# Patient Record
Sex: Female | Born: 1990 | Race: White | Hispanic: No | Smoking: Former smoker
Health system: Southern US, Community
[De-identification: ages and names within clinical notes are randomized; demographics above are authoritative.]

## PROBLEM LIST (undated history)

## (undated) ENCOUNTER — Inpatient Hospital Stay (HOSPITAL_COMMUNITY): Payer: Self-pay

## (undated) DIAGNOSIS — G43909 Migraine, unspecified, not intractable, without status migrainosus: Secondary | ICD-10-CM

## (undated) DIAGNOSIS — R12 Heartburn: Secondary | ICD-10-CM

## (undated) DIAGNOSIS — F419 Anxiety disorder, unspecified: Secondary | ICD-10-CM

## (undated) DIAGNOSIS — Z87440 Personal history of urinary (tract) infections: Secondary | ICD-10-CM

## (undated) DIAGNOSIS — Z87448 Personal history of other diseases of urinary system: Secondary | ICD-10-CM

## (undated) HISTORY — DX: Personal history of urinary (tract) infections: Z87.440

## (undated) HISTORY — DX: Anxiety disorder, unspecified: F41.9

## (undated) HISTORY — DX: Personal history of other diseases of urinary system: Z87.448

## (undated) HISTORY — PX: APPENDECTOMY: SHX54

---
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
## 2009-04-09 ENCOUNTER — Emergency Department (HOSPITAL_COMMUNITY): Admission: EM | Admit: 2009-04-09 | Discharge: 2009-04-09 | Payer: Self-pay | Admitting: Emergency Medicine

## 2009-05-05 ENCOUNTER — Emergency Department (HOSPITAL_COMMUNITY): Admission: EM | Admit: 2009-05-05 | Discharge: 2009-05-05 | Payer: Self-pay | Admitting: Emergency Medicine

## 2009-05-08 ENCOUNTER — Emergency Department (HOSPITAL_COMMUNITY): Admission: EM | Admit: 2009-05-08 | Discharge: 2009-05-09 | Payer: Self-pay | Admitting: Emergency Medicine

## 2009-07-13 ENCOUNTER — Emergency Department (HOSPITAL_COMMUNITY): Admission: EM | Admit: 2009-07-13 | Discharge: 2009-07-13 | Payer: Self-pay | Admitting: Emergency Medicine

## 2009-09-07 ENCOUNTER — Emergency Department (HOSPITAL_COMMUNITY): Admission: EM | Admit: 2009-09-07 | Discharge: 2009-09-08 | Payer: Self-pay | Admitting: Emergency Medicine

## 2009-09-08 ENCOUNTER — Emergency Department (HOSPITAL_COMMUNITY): Admission: EM | Admit: 2009-09-08 | Discharge: 2009-09-08 | Payer: Self-pay | Admitting: Emergency Medicine

## 2009-12-04 ENCOUNTER — Emergency Department (HOSPITAL_COMMUNITY): Admission: EM | Admit: 2009-12-04 | Discharge: 2009-12-04 | Payer: Self-pay | Admitting: Emergency Medicine

## 2010-07-12 ENCOUNTER — Emergency Department (HOSPITAL_COMMUNITY): Admission: EM | Admit: 2010-07-12 | Discharge: 2010-04-24 | Payer: Self-pay | Admitting: Emergency Medicine

## 2010-07-17 ENCOUNTER — Emergency Department (HOSPITAL_COMMUNITY)
Admission: EM | Admit: 2010-07-17 | Discharge: 2010-07-17 | Payer: Self-pay | Source: Home / Self Care | Admitting: Emergency Medicine

## 2010-10-18 LAB — CBC
HCT: 42.2 % (ref 36.0–46.0)
Platelets: 218 10*3/uL (ref 150–400)
RBC: 4.88 MIL/uL (ref 3.87–5.11)
RDW: 13.9 % (ref 11.5–15.5)
WBC: 9.2 10*3/uL (ref 4.0–10.5)

## 2010-10-18 LAB — COMPREHENSIVE METABOLIC PANEL
ALT: 46 U/L — ABNORMAL HIGH (ref 0–35)
Albumin: 3.7 g/dL (ref 3.5–5.2)
Alkaline Phosphatase: 69 U/L (ref 39–117)
Chloride: 107 mEq/L (ref 96–112)
Potassium: 3.2 mEq/L — ABNORMAL LOW (ref 3.5–5.1)
Sodium: 137 mEq/L (ref 135–145)
Total Bilirubin: 0.5 mg/dL (ref 0.3–1.2)
Total Protein: 6.3 g/dL (ref 6.0–8.3)

## 2010-10-18 LAB — URINALYSIS, ROUTINE W REFLEX MICROSCOPIC
Bilirubin Urine: NEGATIVE
Ketones, ur: NEGATIVE mg/dL
Nitrite: NEGATIVE
Protein, ur: NEGATIVE mg/dL
pH: 5.5 (ref 5.0–8.0)

## 2010-10-18 LAB — POCT PREGNANCY, URINE: Preg Test, Ur: NEGATIVE

## 2010-10-18 LAB — URINE MICROSCOPIC-ADD ON

## 2010-10-23 LAB — CBC
HCT: 39.9 % (ref 36.0–46.0)
MCV: 84.8 fL (ref 78.0–100.0)
Platelets: 303 10*3/uL (ref 150–400)
RBC: 4.7 MIL/uL (ref 3.87–5.11)
WBC: 9.6 10*3/uL (ref 4.0–10.5)

## 2010-10-23 LAB — COMPREHENSIVE METABOLIC PANEL
AST: 34 U/L (ref 0–37)
Albumin: 4.4 g/dL (ref 3.5–5.2)
Alkaline Phosphatase: 72 U/L (ref 39–117)
CO2: 23 mEq/L (ref 19–32)
Chloride: 110 mEq/L (ref 96–112)
Creatinine, Ser: 0.55 mg/dL (ref 0.4–1.2)
GFR calc Af Amer: >60 mL/min (ref >60)
GFR calc non Af Amer: >60 mL/min (ref >60)
Potassium: 3.7 mEq/L (ref 3.5–5.1)
Total Bilirubin: 0.1 mg/dL — ABNORMAL LOW (ref 0.3–1.2)

## 2010-10-23 LAB — RAPID URINE DRUG SCREEN, HOSP PERFORMED
Amphetamines: NOT DETECTED
Benzodiazepines: NOT DETECTED
Tetrahydrocannabinol: POSITIVE — AB

## 2010-10-23 LAB — ETHANOL: Alcohol, Ethyl (B): 79 mg/dL — ABNORMAL HIGH (ref 0–10)

## 2010-10-23 LAB — ACETAMINOPHEN LEVEL: Acetaminophen (Tylenol), Serum: 87.5 ug/mL — ABNORMAL HIGH (ref 10–30)

## 2010-10-25 LAB — URINE CULTURE
Colony Count: NO GROWTH
Culture: NO GROWTH

## 2010-10-25 LAB — COMPREHENSIVE METABOLIC PANEL
ALT: 22 U/L (ref 0–35)
AST: 27 U/L (ref 0–37)
Albumin: 4.3 g/dL (ref 3.5–5.2)
Calcium: 9.4 mg/dL (ref 8.4–10.5)
Chloride: 104 mEq/L (ref 96–112)
Creatinine, Ser: 0.51 mg/dL (ref 0.4–1.2)
GFR calc Af Amer: >60 mL/min (ref >60)
Sodium: 139 mEq/L (ref 135–145)
Total Bilirubin: 0.6 mg/dL (ref 0.3–1.2)

## 2010-10-25 LAB — RAPID URINE DRUG SCREEN, HOSP PERFORMED
Cocaine: NOT DETECTED
Tetrahydrocannabinol: POSITIVE — AB

## 2010-10-25 LAB — CBC
MCV: 85.3 fL (ref 78.0–100.0)
Platelets: 317 10*3/uL (ref 150–400)
WBC: 13.8 10*3/uL — ABNORMAL HIGH (ref 4.0–10.5)

## 2010-10-25 LAB — BLOOD GAS, ARTERIAL
O2 Content: 2 L/min
O2 Saturation: 99.1 %
Patient temperature: 98.6

## 2010-10-25 LAB — URINE MICROSCOPIC-ADD ON

## 2010-10-25 LAB — SALICYLATE LEVEL: Salicylate Lvl: <4 mg/dL (ref 2.8–20.0)

## 2010-10-25 LAB — URINALYSIS, ROUTINE W REFLEX MICROSCOPIC
Bilirubin Urine: NEGATIVE
Glucose, UA: NEGATIVE mg/dL
Ketones, ur: NEGATIVE mg/dL
Leukocytes, UA: NEGATIVE
Protein, ur: NEGATIVE mg/dL
pH: 6 (ref 5.0–8.0)

## 2010-10-25 LAB — DIFFERENTIAL
Eosinophils Relative: 1 % (ref 0–5)
Lymphocytes Relative: 34 % (ref 12–46)
Lymphs Abs: 4.6 10*3/uL — ABNORMAL HIGH (ref 0.7–4.0)
Monocytes Absolute: 0.8 10*3/uL (ref 0.1–1.0)

## 2010-10-25 LAB — APTT: aPTT: 30 seconds (ref 24–37)

## 2010-10-25 LAB — POCT PREGNANCY, URINE: Preg Test, Ur: NEGATIVE

## 2010-10-25 LAB — ACETAMINOPHEN LEVEL: Acetaminophen (Tylenol), Serum: <10 ug/mL — ABNORMAL LOW (ref 10–30)

## 2010-11-02 ENCOUNTER — Emergency Department (HOSPITAL_COMMUNITY)
Admission: EM | Admit: 2010-11-02 | Discharge: 2010-11-02 | Disposition: A | Discharge status: Home / Self Care | Payer: Self-pay | Attending: Emergency Medicine | Admitting: Emergency Medicine

## 2010-11-02 DIAGNOSIS — R300 Dysuria: Secondary | ICD-10-CM | POA: Insufficient documentation

## 2010-11-02 DIAGNOSIS — R350 Frequency of micturition: Secondary | ICD-10-CM | POA: Insufficient documentation

## 2010-11-02 DIAGNOSIS — N12 Tubulo-interstitial nephritis, not specified as acute or chronic: Secondary | ICD-10-CM | POA: Insufficient documentation

## 2010-11-02 DIAGNOSIS — R109 Unspecified abdominal pain: Secondary | ICD-10-CM | POA: Insufficient documentation

## 2010-11-02 DIAGNOSIS — R509 Fever, unspecified: Secondary | ICD-10-CM | POA: Insufficient documentation

## 2010-11-02 DIAGNOSIS — R110 Nausea: Secondary | ICD-10-CM | POA: Insufficient documentation

## 2010-11-02 LAB — URINE MICROSCOPIC-ADD ON

## 2010-11-02 LAB — URINALYSIS, ROUTINE W REFLEX MICROSCOPIC
Glucose, UA: NEGATIVE mg/dL
Ketones, ur: NEGATIVE mg/dL
Protein, ur: 100 mg/dL — AB

## 2010-11-02 LAB — POCT PREGNANCY, URINE: Preg Test, Ur: NEGATIVE

## 2010-11-08 LAB — URINALYSIS, ROUTINE W REFLEX MICROSCOPIC
Hgb urine dipstick: NEGATIVE
Nitrite: NEGATIVE
Protein, ur: NEGATIVE mg/dL
Urobilinogen, UA: 0.2 mg/dL (ref 0.0–1.0)

## 2010-11-08 LAB — URINE MICROSCOPIC-ADD ON

## 2010-11-08 LAB — WET PREP, GENITAL: Yeast Wet Prep HPF POC: NONE SEEN

## 2010-11-08 LAB — GC/CHLAMYDIA PROBE AMP, GENITAL
Chlamydia, DNA Probe: NEGATIVE
GC Probe Amp, Genital: NEGATIVE

## 2010-11-09 LAB — GC/CHLAMYDIA PROBE AMP, GENITAL: GC Probe Amp, Genital: NEGATIVE

## 2010-11-09 LAB — WET PREP, GENITAL
Trich, Wet Prep: NONE SEEN
Yeast Wet Prep HPF POC: NONE SEEN

## 2010-11-09 LAB — URINALYSIS, ROUTINE W REFLEX MICROSCOPIC
Glucose, UA: NEGATIVE mg/dL
Ketones, ur: NEGATIVE mg/dL
Protein, ur: NEGATIVE mg/dL

## 2010-11-09 LAB — URINE MICROSCOPIC-ADD ON

## 2010-11-15 LAB — URINE MICROSCOPIC-ADD ON

## 2010-11-15 LAB — URINALYSIS, ROUTINE W REFLEX MICROSCOPIC
Glucose, UA: NEGATIVE mg/dL
Ketones, ur: NEGATIVE mg/dL
Protein, ur: 100 mg/dL — AB

## 2010-11-15 LAB — URINE CULTURE: Colony Count: >=100000

## 2010-11-20 LAB — CBC
HCT: 33.8 % — ABNORMAL LOW (ref 36.0–46.0)
Hemoglobin: 9.5 g/dL — ABNORMAL LOW (ref 12.0–15.0)
MCHC: 33 g/dL (ref 30.0–36.0)
MCV: 83.6 fL (ref 78.0–100.0)
Platelets: 280 10*3/uL (ref 150–400)
RDW: 14.8 % (ref 11.5–15.5)
RDW: 14.8 % (ref 11.5–15.5)
WBC: 12.8 10*3/uL — ABNORMAL HIGH (ref 4.0–10.5)

## 2010-12-18 NOTE — Discharge Summary (Signed)
[name], [name]               ACCOUNT NO.:  [id number]   MEDICAL RECORD NO.:  [id number]          PATIENT TYPE:  INP   LOCATION:  9124                          FACILITY:  [facility]   PHYSICIAN:  [name], MD    DATE OF BIRTH:  18-Jul-1991   DATE OF ADMISSION:  09/17/2008  DATE OF DISCHARGE:                               DISCHARGE SUMMARY   REASON FOR ADMISSION:  Attempted trial of labor after cesarean section.   POSTOPERATIVE DIAGNOSES:  Intrauterine pregnancy at 39-3/7 weeks,  attempted trial of labor after cesarean section,  footling breech  presentation, repeat cesarean section via Pfannenstiel incision by Dr.  Silas Flood with assistance of Dr. Noel Gerold.   HOSPITAL COURSE:  Procedure was uncomplicated.  Postop hemoglobin is  11.3.  The patient is ambulating well taking p.o. fluids and solids  without difficulty, passing flatus, emptying her bladder without  complications, denies complaints.  Desires IUD as contraceptive, will be  placed 6 weeks postpartum.   PHYSICAL EXAMINATION:  VITAL SIGNS:  Stable.  HEART:  Regular rhythm and rate.  LUNGS: Clear to auscultation bilaterally.  ABDOMEN:  Soft, nontender.  Bowel sounds are present x4.  Incision is  intact.  No redness, drainage, swelling.  Fundus is firm.  Lochia, small  amount.  EXTREMITIES:  There is no edema in the lower extremities.   ASSESSMENT:  Stable postoperative day #3.   DISCHARGE MEDICATIONS:  Are as follows:  1. Chromagen Forte 1 p.o. b.i.d.  2. Motrin 600 one p.o. q.6 h. p.r.n. cramping.  3. Lortab 5/325 one p.o. q.4 h. p.r.n. pain.   PLAN:  [facility] nurse is to follow up on postop day 5-7 to remove her  staples.  She is to follow up with [facility] in  6 weeks for postpartum checkup and IUD placement.      Zerita Boers, N.M.      [name], MD  Electronically Signed    DL/MEDQ  D:  16/05/9603  T:  09/20/2008  Job:  [id number]

## 2010-12-18 NOTE — Op Note (Signed)
[name], [name]               ACCOUNT NO.:  [id number]   MEDICAL RECORD NO.:  [id number]          PATIENT TYPE:  INP   LOCATION:  2550                         FACILITY:  [facility]   PHYSICIAN:  Ollen Gross. Vernell Morgans, M.D. DATE OF BIRTH:  02/19/1991   DATE OF PROCEDURE:  11/19/2007  DATE OF DISCHARGE:                               OPERATIVE REPORT   PREOPERATIVE DIAGNOSIS:  Acute appendicitis.   POSTOPERATIVE DIAGNOSIS:  Acute appendicitis.   PROCEDURE:  Laparoscopic appendectomy.   SURGEON:  Dr. Ollen Gross. Vernell Morgans, M.D.   ANESTHESIA:  General endotracheal.   PROCEDURE:  After informed consent was obtained, the patient was brought  to the operating room and placed in supine position on the operating  table.  After induction of general anesthesia, the patient's abdomen was  prepped with Betadine and draped in the usual sterile manner.  The area  below the umbilicus was infiltrated with 0.25% Marcaine.  A small  incision was made with a 15 blade knife.  This incision was carried  through the subcutaneous tissue bluntly with a hemostat and [employer]  retractors until the linea alba was identified.  The linea alba was  incised with a 15 blade knife, and each side was grasped with Kocher  clamps and elevated anteriorly.  The preperitoneal space was then probed  bluntly with a hemostat till the peritoneum was opened and access was  gained to the abdominal cavity.  A 0-Vicryl pursestring stitch was  placed in the fascia surrounding the opening, Hasson cannula was placed  through the opening and anchored in place with the previously placed  Vicryl pursestring stitch.  The abdomen was then insufflated with carbon  dioxide without difficulty.  The patient was placed in Trendelenburg  position and rotated with the right side up.  Next, the suprapubic  region was infiltrated with 0.25% Marcaine.  A small incision was made  with 15 blade knife.  This incision was carried down.  A  10-11 mm port  was then placed bluntly through this incision into the abdominal cavity  under direct vision.  The  site was chosen between the tube ports for  placement of a 5-mm port.  This area was infiltrated with 0.25%  Marcaine.  A small stab incision was made with a 15 blade knife and 5-mm  port was placed bluntly through this incision into the abdominal cavity  under direct vision.  The camera was then moved to the suprapubic port  using a Glassman grasper and the harmonic scalpel.  The right lower  quadrant was inspected.  The appendix was readily identified, it did  appear to be enlarged and inflamed, but no signs of perforation.  The  appendix was able to be elevated with the [facility] and the mesoappendix  was taken down sharply with the harmonic scalpel until the base of the  appendix at its junction with the cecum was identified and cleared of  any tissue.  Next, a laparoscopic 45-mm blue load six-row stapler was  placed through the Hasson cannula across the base of the appendix at its  junction  with the cecum clamped and fired, thereby dividing the base of  the appendix between staple lines.  The laparoscopic bag was inserted  through the Hasson cannula.  The appendix was placed in the bag and bag  was sealed.  The abdomen was then inspected.  The abdomen was then  irrigated with copious amounts of saline.  The staple line was examined  and found to be hemostatic.  No other abnormalities were noted.  On  examining the rest of the abdomen, the bag and appendix were removed  through the infraumbilical port with the Hasson cannula without  difficulty.  The fascial defect was closed, the previously placed Vicryl  pursestring stitch as well as with another figure-of-eight 0-Vicryl  stitch.  The rest of the ports were removed under direct vision and  found to be hemostatic.  Gas was allowed to escape.  The skin incisions  where there is fascia of the suprapubic port was also closed with an   interrupted 0 Vicryl stitch.  The rest of the skin incisions were closed  with interrupted 4-0 Monocryl subcuticular stitches and Dermabond  dressings were applied.  The patient tolerated the procedure well.  At  the end of the case, all needle, sponge, and instrument counts were  correct.  The patient was then awakened and taken to recovery room in  stable condition.      Ollen Gross. Vernell Morgans, M.D.  Electronically Signed     PST/MEDQ  D:  11/19/2007  T:  11/19/2007  Job:  [id number]

## 2010-12-18 NOTE — Op Note (Signed)
[name], [name]               ACCOUNT NO.:  [id number]   MEDICAL RECORD NO.:  [id number]          PATIENT TYPE:  INP   LOCATION:  9134                          FACILITY:  [facility]   PHYSICIAN:  Allie Bossier, MD        DATE OF BIRTH:  [birth year]   DATE OF PROCEDURE:  01/15/2007  DATE OF DISCHARGE:                               OPERATIVE REPORT   PREOPERATIVE DIAGNOSIS:  1. Term intrauterine pregnancy.  2. Fetal bradycardia remote from delivery.   POSTOPERATIVE DIAGNOSIS:  1. Term intrauterine pregnancy.  2. Fetal bradycardia remote from delivery.  3. Nuchal cord.   SURGERY:  Primary low transverse cesarean section via Pfannenstiel skin  incision.   ANESTHESIA:  Spinal and Marcaine for local.   SURGEON:  Nicholaus Bloom, M.D.   ASSISTANT:  Carolanne Grumbling, M.D.   ESTIMATED BLOOD LOSS:  600 mL   IV FLUIDS:  3700 mL.   URINE:  100 mL of clear urine.   INDICATIONS:  20 year old G1 at 40 weeks 4 days was admitted in labor.  The patient had bradycardia down to the 80s that persisted despite IV  fluids, O2, discontinuing Pitocin, and rolling the patient.  The patient  was also given terbutaline.  The patient was taken to the operating  room. Fetal heart tones were improved. Sterile vaginal exam the patient  was still very high and felt to be remote from delivery so the patient  was counseled on risks and benefits and wanted to proceed.   FINDINGS:  Viable female infant in cephalic presentation, Apgars 60/45.  Cord pH 7.23, weight 8 pounds 3 ounces.  Amniotic fluid clear, nuchal  cord x1. The uterus, tubes and ovaries within normal limits.   PROCEDURE:  The patient was taken to the operating room where spinal  anesthesia was placed and found be adequate.  She was prepped and draped  in the usual sterile fashion in the dorsal supine position with a  leftward tilt.  Pfannenstiel skin incision was made, carried through the  fascia. The fascial incision was extended bilaterally and  curved  slightly upwards.  The rectus muscles were partially incised in a  transverse fashion using the Bovie.  Bladder blade was inserted.  Uterine incision was made. Amniotomy was performed with hemostats.  Infant's head was delivered atraumatically using the kiwi vacuum that  was pumped to the green zone.  Nuchal cord x1 was reduced, body  delivered atraumatically.  Cord clamped and cut and infant handed off to  the waiting neonatologist.  the uterus was left in situ and cleaned with  a dry lap sponge.  Uterus closed with 0 Vicryl in a running locking  fashion and excellent hemostasis was obtained.  The ovaries were  palpably normal. Fascia was closed with 0 Vicryl in a running fashion.  The subcutaneous tissue was infiltrated with 30 ml of 0.5% marcaine.  It  was then irrigated, cleaned, and dried.  Skin was closed with Monocryl.  Steri-Strips were placed.  The patient tolerated the procedure and was  taken to the recovery room in stable condition.  Sponge, lap and needle  counts were correct x2.  Ancef was given at cord clamp.  Her Foley  catheter drained clear urine throughout the case.     ______________________________  Marc Morgans. Mayford Knife, M.D.      Allie Bossier, MD  Electronically Signed    TLW/MEDQ  D:  01/15/2007  T:  01/15/2007  Job:  [id number]

## 2010-12-18 NOTE — Discharge Summary (Signed)
[name], [name]               ACCOUNT NO.:  [id number]   MEDICAL RECORD NO.:  [id number]          PATIENT TYPE:  INP   LOCATION:  9134                          [facility]   PHYSICIAN:  Lesly Dukes, M.D. DATE OF BIRTH:  29-Oct-1990   DATE OF ADMISSION:  01/15/2007  DATE OF DISCHARGE:  01/17/2007                               DISCHARGE SUMMARY   DISCHARGE DIAGNOSES:  1. Primary low transverse cesarean section at [redacted] weeks gestation age      for fetal bradycardia, remote from delivery.  2. Anemia secondary to acute blood loss.  Admission hemoglobin 12.5,      discharge hemoglobin 10.2.   HOSPITAL COURSE:  This 20 year old G1 at 42-weeks gestational age,  presented with contractions.  She was initially evaluated at 1 cm,  progressed to 5-6 cm. Fetal heart tones were reactive and reassuring.  Labor went well until she had a first descent fetal bradycardia down to  the 80's.  Patient was taken to the operating room where she was dilated  to 8 cm, still negative 1 station.  Fetal heart tones improved briefly.  The decision was made to proceed with the primary cesarean section.  Please see operative note for details.  At delivery the baby had a  nuchal cord. Apgar's were 8 and 9. Birth weight 8 pounds, 3 ounces.  Cord pH 7.23.   Postoperative course was unremarkable.  Patient tolerated p.o., Foley  was discontinued.   DISPOSITION:  Home (patient requested an early discharge).   DISCHARGE MEDICATIONS:  1. Micronor 1 tablet p.o. daily to start in 2 weeks.  2. Percocet 1-2 p.o. q.4-6 hours p.r.n. pain.  3. Motrin 600 mg one tablet p.o. q.6 hours.   ACTIVITY:  Nothing per vagina, no heavy lifting x6 weeks.   DISCHARGE INSTRUCTIONS:  Return with signs/symptoms of infection or with  increased vaginal bleeding.  [facility]  weeks.     ______________________________  Marc Morgans Mayford Knife, M.D.      Lesly Dukes, M.D.  Electronically  Signed    TLW/MEDQ  D:  01/17/2007  T:  01/17/2007  Job:  [id number]

## 2010-12-18 NOTE — Op Note (Signed)
[name], [name]               ACCOUNT NO.:  [id number]   MEDICAL RECORD NO.:  [id number]          PATIENT TYPE:  INP   LOCATION:  9124                          [facility]   PHYSICIAN:  [name], MD    DATE OF BIRTH:  03-20-1991   DATE OF PROCEDURE:  09/17/2008  DATE OF DISCHARGE:                               OPERATIVE REPORT   PREOPERATIVE DIAGNOSES:  1. Intrauterine pregnancy at 39-3/7th weeks' gestation.  2. Attempted trial of labor after cesarean section.  3. Footling breech presentation.   POSTOPERATIVE DIAGNOSES:  1. Intrauterine pregnancy at 39-3/7th weeks' gestation.  2. Attempted trial of labor after cesarean section.  3. Footling breech presentation.   PROCEDURE:  Repeat low transverse cesarean section via Pfannenstiel  incision.   SURGEON:  [name], MD   ANESTHESIA:  Epidural.   IV FLUIDS:  1500 mL of lactated Ringer.   ESTIMATED BLOOD LOSS:  700 mL.   URINE OUTPUT:  150 mL.   INDICATIONS:  The patient is a 20 year old gravida 2, para 1, who  presented in early labor at 39-3/7th weeks.  On initial examination, the  patient was noted to be 1 cm dilated, but was noted to have spontaneous  rupture of membranes during observation in the emergency room.  At this  point, according to the midwife who examined the patient, the fetal  presentation was cephalic.  The patient had a prior history of a  cesarean section secondary to fetal distress, and desired a trial of  labor after cesarean section.  The patient was subsequently admitted to  Labor and Delivery and was noted to progress to a cervical dilatation of  about 4-5 cm.  However, on cervical check by her nurse, she was noted to  have an extremity presentation.  I was called in for evaluation and on  my examination, a flexed knee joint was palpated protruding into the  vagina and the patient was noted to be about 5 cm dilated.  Fetal head  was not palpated and a bedside ultrasound that was  done confirmed double  footling breech presentation.  Given the non-cephalic presentation and  active labor, the patient was counseled regarding cesarean section.  The  risks of surgery were discussed including but not limited to: bleeding  which might require transfusion, infection which might require  antibiotics, injury to surrounding organs, need for additional  procedures including hysterectomy in the event of a life-threatening  bleed, risk of abnormal placentation in subsequent pregnancies, and not  being a candidate for trial of labor during subsequent pregnancies.  All  questions were answered and written informed consent was obtained.   FINDINGS:  Viable female infant, double footling breech presentation.  There was a body cord x1 and a nuchal cord x1.  Apgars were 2 and 9.  Arterial cord pH was 7.24.  Weight was 8 pounds 4 ounces.  There was an  intact placenta and three-vessel cord was present.  Normal uterus,  tubes, and ovaries bilaterally.  The patient did have a wide rectus  muscle diastasis and dense adhesions of  the uterus to the anterior  abdominal wall.   SPECIMENS:  Placenta, which was sent to Pathology.   COMPLICATIONS:  None immediate.   PROCEDURE DETAILS:  The patient received preoperative IV Ancef and was  taken to the operating room, where her epidural analgesia was dosed up  to a surgical level and found to be adequate.  The patient was then  placed in the dorsal supine position with a leftward tilt, and prepped  and draped in a sterile manner.  A Pfannenstiel incision was made with  the scalpel over her preexisting scar and taken down to the layer of  fascia.  The fascia was incised in the midline and this incision was  extended bilaterally using Mayo scissors.  At this point, it was noted  that the patient had a wide rectus diastasis, and so the rectus muscles  did not need to be dissected off the overlying fascia.  The peritoneum  was entered sharply and  this excision was extended bilaterally with good  visualization of the bowel and bladder.  At this point, there were some  dense adhesions that were noted involving the anterior surface of the  uterus and the peritoneum, which were lysed using both sharp and blunt  methods.  A bladder flap was created and the bladder blade was placed at  this point.  A transverse hysterotomy was made with a scalpel. and  extended bilaterally using blunt methods.  The infant's lower  extremities were delivered and the rest of the corpus was delivered  without difficulty.  There was significant difficulty in delivering the  infant's head and this was ameliorated by extending the hysterotomy  bilaterally, and the infant's head was able to be delivered in a flexed  position.  Of note, the infant had one body cord and one nuchal cord  that were reduced.  The umbilical cord was clamped and cut, and the  infant was handed over to the awaiting neonatologists.  Cord pH sample  was obtained.  Fundal massage was then administered and the placenta  delivered intact.  The uterus was then exteriorized and cleared of all  clot and debris using dry laparotomy sponges.  The hysterotomy was  repaired using 0 Monocryl in a running locked fashion.  A second layer  of 0 Monocryl was used as an imbricating layer.  Overall, good  hemostasis was noted.  The pelvis and gutters were cleared of all clot  and debris, and the uterus was returned to the abdomen.  The peritoneum  was reapproximated using a 0 Vicryl running stitch, and the fascia was  reapproximated using 0 PDS in a running fashion.  The subcutaneous layer  was irrigated using a wet sponge, and the skin was closed with staples.  The patient tolerated the procedure well.  Sponge, instrument, and  needle counts were correct x3.  She was taken to the recovery room in  stable condition.      [name], MD  Electronically Signed     UAD/MEDQ  D:  09/17/2008  T:   09/17/2008  Job:  [id number]

## 2010-12-18 NOTE — H&P (Signed)
NAMELYRIK, DOCKSTADER               ACCOUNT NO.:  [id number]   MEDICAL RECORD NO.:  [id number]          PATIENT TYPE:  EMS   LOCATION:  MAJO                         FACILITY:  [facility]   PHYSICIAN:  Ollen Gross. Vernell Morgans, M.D. DATE OF BIRTH:  09-Apr-1991   DATE OF ADMISSION:  11/19/2007  DATE OF DISCHARGE:                              HISTORY & PHYSICAL   HISTORY OF PRESENT ILLNESS:  Ms. Misch is an 20 year old white female  who presented to the emergency department tonight with right lower  quadrant pain, which started this afternoon.  Pain has been associated  with nausea and vomiting.  She denies any fevers.  She has never had a  pain quite like this before.  She otherwise denies any chest pain,  shortness of breath, diarrhea, and dysuria.  Rest of her review of  systems are unremarkable.   PAST MEDICAL HISTORY:  Significant for asthma and urinary tract  infection.   PAST SURGICAL HISTORY:  Significant for a C-section.   MEDICATIONS:  Include birth control pills.   ALLERGIES:  No known drug allergies.   SOCIAL HISTORY:  She denies use of alcohol or tobacco products.   FAMILY HISTORY:  Significant for coronary artery disease, COPD, and  hypertension in her parents.   PHYSICAL EXAMINATION:  VITAL SIGNS:  Temperature 97.2, blood pressure  111/62, and pulse 72.  GENERAL:  She is a well-developed, well-nourished white female in no  acute distress.  SKIN:  Warm and dry.  No jaundice.  EYES:  Extraocular muscles intact.  Pupils are equal, round, and  reactive to light.  Sclerae nonicteric.  LUNGS:  Clear bilaterally.  No use of accessory respiratory muscles.  HEART:  Has regular rate and rhythm with impulse in the left chest.  ABDOMEN:  Soft with focal right lower quadrant pain, but no guarding or  peritonitis.  EXTREMITIES:  No cyanosis, clubbing, or edema.  Good strength in arms  and legs.  PSYCHOLOGIC:  She is alert and oriented x3 with no signs of anxiety or  depression.   LABORATORY DATA:  On review of her lab work, it was significant for a  white count 12.3.  CT scan was reviewed with the radiologist and did  show an enlarged inflamed appendix.   ASSESSMENT AND PLAN:  This is an 20 year old white female with what  appears to be acute appendicitis.  Because of risk of perforation and  sepsis, I think she would benefit from having her appendix removed.  I  have  explained her in detail the risk and benefits of the operation to remove  the appendix as well some other technical aspects and she understands  and wants to proceed.  We will obtain routine preoperative lab work and  preparation in doing this for tonight.      Ollen Gross. Vernell Morgans, M.D.  Electronically Signed     PST/MEDQ  D:  11/19/2007  T:  11/19/2007  Job:  [id number]

## 2011-01-05 ENCOUNTER — Emergency Department (HOSPITAL_COMMUNITY)
Admission: EM | Admit: 2011-01-05 | Discharge: 2011-01-05 | Disposition: A | Discharge status: Home / Self Care | Payer: Self-pay | Attending: Emergency Medicine | Admitting: Emergency Medicine

## 2011-01-05 DIAGNOSIS — M546 Pain in thoracic spine: Secondary | ICD-10-CM | POA: Insufficient documentation

## 2011-01-05 LAB — URINALYSIS, ROUTINE W REFLEX MICROSCOPIC
Glucose, UA: NEGATIVE mg/dL
Ketones, ur: NEGATIVE mg/dL
Protein, ur: NEGATIVE mg/dL

## 2011-04-30 LAB — POCT I-STAT, CHEM 8
Calcium, Ion: 1.17
HCT: 34 — ABNORMAL LOW
Hemoglobin: 11.6 — ABNORMAL LOW
TCO2: 22

## 2011-04-30 LAB — URINE MICROSCOPIC-ADD ON

## 2011-04-30 LAB — WET PREP, GENITAL
Clue Cells Wet Prep HPF POC: NONE SEEN
Trich, Wet Prep: NONE SEEN
Yeast Wet Prep HPF POC: NONE SEEN

## 2011-04-30 LAB — URINALYSIS, ROUTINE W REFLEX MICROSCOPIC
Bilirubin Urine: NEGATIVE
Glucose, UA: NEGATIVE
Hgb urine dipstick: NEGATIVE
Ketones, ur: >80 — AB
Ketones, ur: NEGATIVE
Nitrite: NEGATIVE
Protein, ur: NEGATIVE
pH: 7.5

## 2011-04-30 LAB — CBC
MCHC: 32.6
MCV: 82.2
Platelets: 353
WBC: 12.3 — ABNORMAL HIGH

## 2011-04-30 LAB — DIFFERENTIAL
Basophils Absolute: 0.1
Basophils Relative: 1
Eosinophils Absolute: 0.1
Lymphs Abs: 2.8
Neutrophils Relative %: 71

## 2011-04-30 LAB — POCT PREGNANCY, URINE
Operator id: 146091
Operator id: 282201
Preg Test, Ur: NEGATIVE
Preg Test, Ur: NEGATIVE

## 2011-04-30 LAB — URINE CULTURE: Colony Count: 70000

## 2011-05-02 LAB — URINE CULTURE

## 2011-05-02 LAB — URINALYSIS, ROUTINE W REFLEX MICROSCOPIC
Bilirubin Urine: NEGATIVE
Ketones, ur: NEGATIVE
Nitrite: NEGATIVE
pH: 6.5

## 2011-05-02 LAB — POCT PREGNANCY, URINE
Operator id: 223331
Preg Test, Ur: POSITIVE

## 2011-05-02 LAB — URINE MICROSCOPIC-ADD ON

## 2011-05-06 LAB — URINALYSIS, ROUTINE W REFLEX MICROSCOPIC
Bilirubin Urine: NEGATIVE
Glucose, UA: NEGATIVE
Hgb urine dipstick: NEGATIVE
Ketones, ur: NEGATIVE
Nitrite: NEGATIVE
Protein, ur: NEGATIVE
Specific Gravity, Urine: 1.015
Urobilinogen, UA: 0.2
pH: 6.5

## 2011-05-06 LAB — WET PREP, GENITAL: Yeast Wet Prep HPF POC: NONE SEEN

## 2011-05-10 LAB — URINALYSIS, ROUTINE W REFLEX MICROSCOPIC
Glucose, UA: NEGATIVE mg/dL
Nitrite: NEGATIVE
Specific Gravity, Urine: 1.01 (ref 1.005–1.030)
pH: 6.5 (ref 5.0–8.0)

## 2011-05-22 LAB — URINALYSIS, ROUTINE W REFLEX MICROSCOPIC
Nitrite: NEGATIVE
Specific Gravity, Urine: 1.006
Urobilinogen, UA: 0.2
pH: 7

## 2011-05-22 LAB — URINE MICROSCOPIC-ADD ON

## 2011-05-23 LAB — CBC
HCT: 38.2
Hemoglobin: 12.5
MCHC: 32.6
MCHC: 33.1
MCV: 83.3
Platelets: 228
Platelets: 265
RBC: 3.71 — ABNORMAL LOW
RDW: 15.8 — ABNORMAL HIGH
WBC: 14.9 — ABNORMAL HIGH

## 2013-04-23 ENCOUNTER — Emergency Department (HOSPITAL_COMMUNITY)
Admission: EM | Admit: 2013-04-23 | Discharge: 2013-04-23 | Disposition: A | Discharge status: Home / Self Care | Payer: Self-pay | Attending: Emergency Medicine | Admitting: Emergency Medicine

## 2013-04-23 ENCOUNTER — Encounter (HOSPITAL_COMMUNITY): Payer: Self-pay | Admitting: Emergency Medicine

## 2013-04-23 DIAGNOSIS — N390 Urinary tract infection, site not specified: Secondary | ICD-10-CM | POA: Insufficient documentation

## 2013-04-23 DIAGNOSIS — Z79899 Other long term (current) drug therapy: Secondary | ICD-10-CM | POA: Insufficient documentation

## 2013-04-23 DIAGNOSIS — Z3202 Encounter for pregnancy test, result negative: Secondary | ICD-10-CM | POA: Insufficient documentation

## 2013-04-23 DIAGNOSIS — F17200 Nicotine dependence, unspecified, uncomplicated: Secondary | ICD-10-CM | POA: Insufficient documentation

## 2013-04-23 LAB — URINALYSIS, ROUTINE W REFLEX MICROSCOPIC
Glucose, UA: NEGATIVE mg/dL
Ketones, ur: NEGATIVE mg/dL
Nitrite: POSITIVE — AB
Protein, ur: NEGATIVE mg/dL

## 2013-04-23 LAB — POCT I-STAT, CHEM 8
Calcium, Ion: 1.25 mmol/L — ABNORMAL HIGH (ref 1.12–1.23)
Glucose, Bld: 82 mg/dL (ref 70–99)
HCT: 45 % (ref 36.0–46.0)
Hemoglobin: 15.3 g/dL — ABNORMAL HIGH (ref 12.0–15.0)
TCO2: 24 mmol/L (ref 0–100)

## 2013-04-23 LAB — URINE MICROSCOPIC-ADD ON

## 2013-04-23 LAB — PREGNANCY, URINE: Preg Test, Ur: NEGATIVE

## 2013-04-23 MED ORDER — SULFAMETHOXAZOLE-TRIMETHOPRIM 800-160 MG PO TABS: 1.0000 | ORAL_TABLET | Freq: Two times a day (BID) | ORAL | Status: DC

## 2013-04-23 NOTE — ED Provider Notes (Signed)
Medical screening examination/treatment/procedure(s) were conducted as a shared visit with non-physician practitioner(s) and myself.  I personally evaluated the patient during the encounter  Doug Sou, MD 04/23/13 2140

## 2013-04-23 NOTE — ED Provider Notes (Signed)
Complains of bilateral flank pain onset 4 days ago today pain is presently mild worse with urination. Located at right lower quadrant radiating to right flank. No pain at present. On exam she is alert no distress abdomen minimally tender at right lower quadrant no flank tenderness   Doug Sou, MD 04/23/13 2140

## 2013-04-23 NOTE — ED Notes (Signed)
Onset 4 days ago bilateral flank pain radiating RLQ. Denies urinary complaints. Pain currently 0/10 because took 3 tabs of 500 mg tylenol prior to arrival.

## 2013-04-23 NOTE — ED Provider Notes (Signed)
CSN: [id number]     Arrival date & time 04/23/13  2440 History   First MD Initiated Contact with Patient 04/23/13 1117     Chief Complaint  Patient presents with  . Flank Pain   (Consider location/radiation/quality/duration/timing/severity/associated sxs/prior Treatment) Patient is a 22 y.o. female presenting with flank pain. The history is provided by the patient.  Flank Pain This is a new problem. The current episode started in the past 7 days. The problem occurs constantly. Associated symptoms include abdominal pain. Pertinent negatives include no chills, fever, nausea or vomiting. Associated symptoms comments: Flank pain that was initially right and now bilateral starting 4 days ago and associated with radiation with constant discomfort in right lower abdomen. It worsens with urination. No N, V, fever. She denies vaginal discharge or bleeding. No history of kidney stones..    History reviewed. No pertinent past medical history. History reviewed. No pertinent past surgical history. No family history on file. History  Substance Use Topics  . Smoking status: Current Some Day Smoker  . Smokeless tobacco: Not on file  . Alcohol Use: Yes   OB History   Grav Para Term Preterm Abortions TAB SAB Ect Mult Living                 Review of Systems  Constitutional: Negative for fever and chills.  Respiratory: Negative.  Negative for shortness of breath.   Gastrointestinal: Positive for abdominal pain. Negative for nausea and vomiting.  Genitourinary: Positive for dysuria and flank pain.  Musculoskeletal: Negative.   Skin: Negative.   Neurological: Negative.     Allergies  Review of patient's allergies indicates no known allergies.  Home Medications   Current Outpatient Rx  Name  Route  Sig  Dispense  Refill  . acetaminophen (TYLENOL) 500 MG tablet   Oral   Take 500 mg by mouth every 6 (six) hours as needed for pain.         . Ascorbic Acid (VITAMIN C PO)   Oral   Take 1  tablet by mouth daily.         . B Complex Vitamins (VITAMIN B COMPLEX PO)   Oral   Take 1 tablet by mouth daily.         . Multiple Vitamin (MULTIVITAMIN WITH MINERALS) TABS tablet   Oral   Take 1 tablet by mouth daily.          BP 102/59  Pulse 77  Temp(Src) 97.8 F (36.6 C)  Resp 18  Ht 5\' 3"  (1.6 m)  Wt 142 lb (64.411 kg)  BMI 25.16 kg/m2  SpO2 100% Physical Exam  Constitutional: She is oriented to person, place, and time. She appears well-developed and well-nourished.  HENT:  Head: Normocephalic.  Neck: Normal range of motion. Neck supple.  Pulmonary/Chest: Effort normal.  Abdominal: Soft. Bowel sounds are normal. There is no tenderness. There is no rebound and no guarding.  Completely non-tender abdomen.  Musculoskeletal: Normal range of motion.  Neurological: She is alert and oriented to person, place, and time.  Skin: Skin is warm and dry. No rash noted.  Psychiatric: She has a normal mood and affect.    ED Course  Procedures (including critical care time) Labs Review Labs Reviewed  POCT I-STAT, CHEM 8 - Abnormal; Notable for the following:    Calcium, Ion 1.25 (*)    Hemoglobin 15.3 (*)    All other components within normal limits  URINALYSIS, ROUTINE W REFLEX MICROSCOPIC  PREGNANCY, URINE  Results for orders placed during the hospital encounter of 04/23/13  URINALYSIS, ROUTINE W REFLEX MICROSCOPIC      Result Value Range   Color, Urine YELLOW  YELLOW   APPearance CLEAR  CLEAR   Specific Gravity, Urine 1.012  1.005 - 1.030   pH 7.5  5.0 - 8.0   Glucose, UA NEGATIVE  NEGATIVE mg/dL   Hgb urine dipstick MODERATE (*) NEGATIVE   Bilirubin Urine NEGATIVE  NEGATIVE   Ketones, ur NEGATIVE  NEGATIVE mg/dL   Protein, ur NEGATIVE  NEGATIVE mg/dL   Urobilinogen, UA 0.2  0.0 - 1.0 mg/dL   Nitrite POSITIVE (*) NEGATIVE   Leukocytes, UA TRACE (*) NEGATIVE  PREGNANCY, URINE      Result Value Range   Preg Test, Ur NEGATIVE  NEGATIVE  URINE  MICROSCOPIC-ADD ON      Result Value Range   Squamous Epithelial / LPF FEW (*) RARE   WBC, UA 0-2  <3 WBC/hpf   RBC / HPF 3-6  <3 RBC/hpf   Bacteria, UA FEW (*) RARE  POCT I-STAT, CHEM 8      Result Value Range   Sodium 139  135 - 145 mEq/L   Potassium 4.3  3.5 - 5.1 mEq/L   Chloride 107  96 - 112 mEq/L   BUN 7  6 - 23 mg/dL   Creatinine, Ser 1.61  0.50 - 1.10 mg/dL   Glucose, Bld 82  70 - 99 mg/dL   Calcium, Ion 0.96 (*) 1.12 - 1.23 mmol/L   TCO2 24  0 - 100 mmol/L   Hemoglobin 15.3 (*) 12.0 - 15.0 g/dL   HCT 04.5  40.9 - 81.1 %    Imaging Review No results found.  MDM  No diagnosis found. 1. UTI  Re-evaluation: abdomen remains benign to exam, minimal tenderness to RLQ. UA shows nitrite positive urine, without bacteria or WBCs. Culture pending. Discussed with patient - will start abx and refer to PCP.     Arnoldo Hooker, PA-C 04/23/13 1331

## 2013-05-06 ENCOUNTER — Encounter (HOSPITAL_COMMUNITY): Payer: Self-pay | Admitting: *Deleted

## 2013-05-06 ENCOUNTER — Emergency Department (HOSPITAL_COMMUNITY)
Admission: EM | Admit: 2013-05-06 | Discharge: 2013-05-06 | Disposition: A | Discharge status: Home / Self Care | Payer: Self-pay | Attending: Emergency Medicine | Admitting: Emergency Medicine

## 2013-05-06 ENCOUNTER — Emergency Department (HOSPITAL_COMMUNITY): Payer: Self-pay

## 2013-05-06 DIAGNOSIS — F17200 Nicotine dependence, unspecified, uncomplicated: Secondary | ICD-10-CM | POA: Insufficient documentation

## 2013-05-06 DIAGNOSIS — J069 Acute upper respiratory infection, unspecified: Secondary | ICD-10-CM | POA: Insufficient documentation

## 2013-05-06 DIAGNOSIS — Z79899 Other long term (current) drug therapy: Secondary | ICD-10-CM | POA: Insufficient documentation

## 2013-05-06 LAB — BASIC METABOLIC PANEL
BUN: 7 mg/dL (ref 6–23)
Calcium: 9.6 mg/dL (ref 8.4–10.5)
Creatinine, Ser: 0.57 mg/dL (ref 0.50–1.10)
GFR calc Af Amer: >90 mL/min (ref >90)
GFR calc non Af Amer: >90 mL/min (ref >90)

## 2013-05-06 LAB — CBC WITH DIFFERENTIAL/PLATELET
Basophils Absolute: 0 10*3/uL (ref 0.0–0.1)
Basophils Relative: 0 % (ref 0–1)
HCT: 39.4 % (ref 36.0–46.0)
MCHC: 34.8 g/dL (ref 30.0–36.0)
Monocytes Absolute: 0.8 10*3/uL (ref 0.1–1.0)
Neutro Abs: 7.5 10*3/uL (ref 1.7–7.7)
Neutrophils Relative %: 74 % (ref 43–77)
Platelets: 282 10*3/uL (ref 150–400)
RDW: 12.6 % (ref 11.5–15.5)
WBC: 10.1 10*3/uL (ref 4.0–10.5)

## 2013-05-06 MED ORDER — BENZONATATE 100 MG PO CAPS: 100.0000 mg | ORAL_CAPSULE | Freq: Three times a day (TID) | ORAL | Status: DC | PRN

## 2013-05-06 NOTE — ED Notes (Signed)
Pt with knot to R post neck and R post head (pea size) x 3 days and cough, rhinitis and sore throat since last night.

## 2013-05-06 NOTE — ED Notes (Signed)
No answer when called x1 

## 2013-05-06 NOTE — ED Provider Notes (Signed)
CSN: [id number]     Arrival date & time 05/06/13  1747 History   First MD Initiated Contact with Patient 05/06/13 1928     Chief Complaint  Patient presents with  . URI   (Consider location/radiation/quality/duration/timing/severity/associated sxs/prior Treatment) Patient is a 22 y.o. female presenting with URI.  URI Presenting symptoms: congestion, cough, rhinorrhea and sore throat   Presenting symptoms: no fever   Severity:  Moderate Onset quality:  Gradual Duration:  2 days Timing:  Constant Progression:  Unchanged Chronicity:  New Relieved by:  Nothing Worsened by:  Nothing tried Ineffective treatments:  None tried Associated symptoms: swollen glands (occiput)   Associated symptoms: no headaches, no neck pain and no wheezing     History reviewed. No pertinent past medical history. Past Surgical History  Procedure Laterality Date  . Cesarean section    . Appendectomy     No family history on file. History  Substance Use Topics  . Smoking status: Current Some Day Smoker -- 0.15 packs/day    Types: Cigarettes  . Smokeless tobacco: Not on file  . Alcohol Use: Yes     Comment: occ   OB History   Grav Para Term Preterm Abortions TAB SAB Ect Mult Living                 Review of Systems  Constitutional: Negative for fever and chills.  HENT: Positive for congestion, sore throat and rhinorrhea. Negative for neck pain.   Eyes: Negative for photophobia and visual disturbance.  Respiratory: Positive for cough. Negative for shortness of breath and wheezing.   Cardiovascular: Negative for chest pain and leg swelling.  Gastrointestinal: Negative for nausea, vomiting, abdominal pain, diarrhea and constipation.  Endocrine: Negative for polyphagia and polyuria.  Genitourinary: Negative for dysuria, flank pain, vaginal bleeding, vaginal discharge and enuresis.  Musculoskeletal: Negative for back pain and gait problem.  Skin: Negative for color change and rash.  Neurological:  Negative for dizziness, syncope, light-headedness, numbness and headaches.  Hematological: Negative for adenopathy. Does not bruise/bleed easily.  All other systems reviewed and are negative.    Allergies  Review of patient's allergies indicates no known allergies.  Home Medications   Current Outpatient Rx  Name  Route  Sig  Dispense  Refill  . Ascorbic Acid (VITAMIN C PO)   Oral   Take 1 tablet by mouth daily.         . B Complex Vitamins (VITAMIN B COMPLEX PO)   Oral   Take 1 tablet by mouth daily.         . Ibuprofen (IBU PO)   Oral   Take 2 tablets by mouth every 6 (six) hours as needed (pain).         Marland Kitchen levonorgestrel (MIRENA) 20 MCG/24HR IUD   Intrauterine   1 each by Intrauterine route once.         . Multiple Vitamin (MULTIVITAMIN WITH MINERALS) TABS tablet   Oral   Take 1 tablet by mouth daily.         . benzonatate (TESSALON PERLES) 100 MG capsule   Oral   Take 1 capsule (100 mg total) by mouth 3 (three) times daily as needed for cough.   20 capsule   0    BP 122/67  Pulse 89  Temp(Src) 99.1 F (37.3 C) (Oral)  Resp 16  Ht 5\' 3"  (1.6 m)  Wt 142 lb (64.411 kg)  BMI 25.16 kg/m2  SpO2 100% Physical Exam  Vitals reviewed.  Constitutional: She is oriented to person, place, and time. She appears well-developed and well-nourished.  HENT:  Head: Normocephalic and atraumatic.  Right Ear: External ear normal.  Left Ear: External ear normal.  Eyes: Conjunctivae and EOM are normal. Pupils are equal, round, and reactive to light.  Neck: Normal range of motion. Neck supple.  Cardiovascular: Normal rate, regular rhythm, normal heart sounds and intact distal pulses.   Pulmonary/Chest: Effort normal and breath sounds normal.  Abdominal: Soft. Bowel sounds are normal. There is no tenderness.  Musculoskeletal: Normal range of motion.  Lymphadenopathy:       Head (right side): Occipital adenopathy present.       Head (left side): No occipital adenopathy  present.    She has cervical adenopathy.       Left cervical: Superficial cervical adenopathy present.  Neurological: She is alert and oriented to person, place, and time.  Skin: Skin is warm and dry.    ED Course  Procedures (including critical care time) Labs Review Labs Reviewed  BASIC METABOLIC PANEL  CBC WITH DIFFERENTIAL   Results for orders placed during the hospital encounter of 05/06/13  BASIC METABOLIC PANEL      Result Value Range   Sodium 137  135 - 145 mEq/L   Potassium 3.9  3.5 - 5.1 mEq/L   Chloride 100  96 - 112 mEq/L   CO2 24  19 - 32 mEq/L   Glucose, Bld 82  70 - 99 mg/dL   BUN 7  6 - 23 mg/dL   Creatinine, Ser 9.60  0.50 - 1.10 mg/dL   Calcium 9.6  8.4 - 45.4 mg/dL   GFR calc non Af Amer >90  >90 mL/min   GFR calc Af Amer >90  >90 mL/min  CBC WITH DIFFERENTIAL      Result Value Range   WBC 10.1  4.0 - 10.5 K/uL   RBC 4.45  3.87 - 5.11 MIL/uL   Hemoglobin 13.7  12.0 - 15.0 g/dL   HCT 09.8  11.9 - 14.7 %   MCV 88.5  78.0 - 100.0 fL   MCH 30.8  26.0 - 34.0 pg   MCHC 34.8  30.0 - 36.0 g/dL   RDW 82.9  56.2 - 13.0 %   Platelets 282  150 - 400 K/uL   Neutrophils Relative % 74  43 - 77 %   Neutro Abs 7.5  1.7 - 7.7 K/uL   Lymphocytes Relative 17  12 - 46 %   Lymphs Abs 1.8  0.7 - 4.0 K/uL   Monocytes Relative 8  3 - 12 %   Monocytes Absolute 0.8  0.1 - 1.0 K/uL   Eosinophils Relative 1  0 - 5 %   Eosinophils Absolute 0.1  0.0 - 0.7 K/uL   Basophils Relative 0  0 - 1 %   Basophils Absolute 0.0  0.0 - 0.1 K/uL    Imaging Review Dg Chest 2 View  05/06/2013   CLINICAL DATA:  Cough. Upper respiratory infection. Smoker.  EXAM: CHEST  2 VIEW  COMPARISON:  None.  FINDINGS: The heart size and mediastinal contours are within normal limits. Both lungs are clear. The visualized skeletal structures are unremarkable.  IMPRESSION: No active cardiopulmonary disease.   Electronically Signed   By: Myles Rosenthal   On: 05/06/2013 19:01    MDM   1. Upper respiratory  infection    22 y.o. female  without pertinent PMH presents with cough, congestion, rhinorrhea x2 days, bump  on back of head x 3 days.  No fevers, gi symptoms, chest pain.  Physical exam as above with lungs CTAB, occipital LAD.  CXR and labwork unremarkable.  DC home in stable condition with standard precautions.     Labs and imaging as above reviewed by myself and attending,Dr. Wilkie Aye, with whom case was discussed.   1. Upper respiratory infection         Noel Gerold, MD 05/06/13 1958

## 2013-05-07 NOTE — ED Provider Notes (Signed)
I saw and evaluated the patient, reviewed the resident's note and I agree with the findings and plan.  This is a 22 year old female who presents with cough, congestion, and sore throat for one day. She is nontoxic-appearing on exam and her vital signs are within normal limits. She's had no fevers have low suspicion for pneumonia. Oropharynx is clear. posterior cervical lymphadenopathy noted on exam.  Patient was encouraged to use over-the-counter medications for symptomatic management.  Shon Baton, MD 05/07/13 [phone]

## 2013-11-14 ENCOUNTER — Ambulatory Visit: Payer: 59

## 2014-04-23 ENCOUNTER — Emergency Department (HOSPITAL_COMMUNITY)
Admission: EM | Admit: 2014-04-23 | Discharge: 2014-04-23 | Disposition: A | Discharge status: Home / Self Care | Payer: 59 | Attending: Emergency Medicine | Admitting: Emergency Medicine

## 2014-04-23 ENCOUNTER — Encounter (HOSPITAL_COMMUNITY): Payer: Self-pay | Admitting: Emergency Medicine

## 2014-04-23 DIAGNOSIS — Z9889 Other specified postprocedural states: Secondary | ICD-10-CM | POA: Diagnosis not present

## 2014-04-23 DIAGNOSIS — R112 Nausea with vomiting, unspecified: Secondary | ICD-10-CM | POA: Insufficient documentation

## 2014-04-23 DIAGNOSIS — Z79899 Other long term (current) drug therapy: Secondary | ICD-10-CM | POA: Insufficient documentation

## 2014-04-23 DIAGNOSIS — N898 Other specified noninflammatory disorders of vagina: Secondary | ICD-10-CM | POA: Diagnosis not present

## 2014-04-23 DIAGNOSIS — Z3202 Encounter for pregnancy test, result negative: Secondary | ICD-10-CM | POA: Diagnosis not present

## 2014-04-23 DIAGNOSIS — R197 Diarrhea, unspecified: Secondary | ICD-10-CM | POA: Diagnosis not present

## 2014-04-23 DIAGNOSIS — Z9089 Acquired absence of other organs: Secondary | ICD-10-CM | POA: Diagnosis not present

## 2014-04-23 DIAGNOSIS — Z87891 Personal history of nicotine dependence: Secondary | ICD-10-CM | POA: Insufficient documentation

## 2014-04-23 LAB — CBC WITH DIFFERENTIAL/PLATELET
Basophils Absolute: 0 10*3/uL (ref 0.0–0.1)
Basophils Relative: 0 % (ref 0–1)
EOS ABS: 0 10*3/uL (ref 0.0–0.7)
EOS PCT: 0 % (ref 0–5)
HEMATOCRIT: 41.5 % (ref 36.0–46.0)
Hemoglobin: 13.9 g/dL (ref 12.0–15.0)
LYMPHS ABS: 1.3 10*3/uL (ref 0.7–4.0)
Lymphocytes Relative: 12 % (ref 12–46)
MCH: 28.9 pg (ref 26.0–34.0)
MCHC: 33.5 g/dL (ref 30.0–36.0)
MCV: 86.3 fL (ref 78.0–100.0)
MONO ABS: 0.2 10*3/uL (ref 0.1–1.0)
Monocytes Relative: 2 % — ABNORMAL LOW (ref 3–12)
Neutro Abs: 8.6 10*3/uL — ABNORMAL HIGH (ref 1.7–7.7)
Neutrophils Relative %: 86 % — ABNORMAL HIGH (ref 43–77)
PLATELETS: 277 10*3/uL (ref 150–400)
RBC: 4.81 MIL/uL (ref 3.87–5.11)
RDW: 12.2 % (ref 11.5–15.5)
WBC: 10.1 10*3/uL (ref 4.0–10.5)

## 2014-04-23 LAB — COMPREHENSIVE METABOLIC PANEL
ALT: 22 U/L (ref 0–35)
ANION GAP: 16 — AB (ref 5–15)
AST: 24 U/L (ref 0–37)
Albumin: 4.2 g/dL (ref 3.5–5.2)
Alkaline Phosphatase: 57 U/L (ref 39–117)
BUN: 10 mg/dL (ref 6–23)
CALCIUM: 9.1 mg/dL (ref 8.4–10.5)
CO2: 22 mEq/L (ref 19–32)
CREATININE: 0.59 mg/dL (ref 0.50–1.10)
Chloride: 105 mEq/L (ref 96–112)
GLUCOSE: 111 mg/dL — AB (ref 70–99)
Potassium: 3.9 mEq/L (ref 3.7–5.3)
SODIUM: 143 meq/L (ref 137–147)
TOTAL PROTEIN: 7.3 g/dL (ref 6.0–8.3)
Total Bilirubin: 0.3 mg/dL (ref 0.3–1.2)

## 2014-04-23 LAB — URINE MICROSCOPIC-ADD ON

## 2014-04-23 LAB — URINALYSIS, ROUTINE W REFLEX MICROSCOPIC
Bilirubin Urine: NEGATIVE
Glucose, UA: NEGATIVE mg/dL
HGB URINE DIPSTICK: NEGATIVE
Ketones, ur: NEGATIVE mg/dL
NITRITE: NEGATIVE
PROTEIN: NEGATIVE mg/dL
SPECIFIC GRAVITY, URINE: 1.02 (ref 1.005–1.030)
UROBILINOGEN UA: 0.2 mg/dL (ref 0.0–1.0)
pH: 8 (ref 5.0–8.0)

## 2014-04-23 LAB — WET PREP, GENITAL
TRICH WET PREP: NONE SEEN
WBC WET PREP: NONE SEEN
Yeast Wet Prep HPF POC: NONE SEEN

## 2014-04-23 LAB — POC URINE PREG, ED: Preg Test, Ur: NEGATIVE

## 2014-04-23 MED ORDER — ONDANSETRON HCL 4 MG/2ML IJ SOLN
4.0000 mg | Freq: Once | INTRAMUSCULAR | Status: AC
  Administered 2014-04-23: 4 mg via INTRAVENOUS
  Filled 2014-04-23: qty 2

## 2014-04-23 MED ORDER — SODIUM CHLORIDE 0.9 % IV BOLUS (SEPSIS)
1000.0000 mL | Freq: Once | INTRAVENOUS | Status: AC
  Administered 2014-04-23: 1000 mL via INTRAVENOUS

## 2014-04-23 MED ORDER — METOCLOPRAMIDE HCL 5 MG/ML IJ SOLN
10.0000 mg | Freq: Once | INTRAMUSCULAR | Status: AC
  Administered 2014-04-23: 10 mg via INTRAVENOUS
  Filled 2014-04-23: qty 2

## 2014-04-23 MED ORDER — ONDANSETRON 4 MG PO TBDP: 4.0000 mg | ORAL_TABLET | Freq: Three times a day (TID) | ORAL | Status: DC | PRN

## 2014-04-23 NOTE — ED Notes (Signed)
Pt states that she tolerated the cup of water without any nausea or vomiting.

## 2014-04-23 NOTE — ED Notes (Signed)
N/v every 10 minutes. Some blood in emesis.  Lower umbilical pain.

## 2014-04-23 NOTE — ED Notes (Signed)
Pt up ambulatory to the bathroom at this time with no difficulty or distress; pt attempting to provide an urine sample also

## 2014-04-23 NOTE — ED Provider Notes (Signed)
CSN: [id number]     Arrival date & time 04/23/14  1442 History   First MD Initiated Contact with Patient 04/23/14 1613     Chief Complaint  Patient presents with  . Abdominal Pain  . Emesis  . Nausea     Patient is a 23 y.o. female presenting with abdominal pain and vomiting. The history is provided by the patient.  Abdominal Pain Pain location:  Periumbilical Pain quality: cramping   Pain radiates to:  Does not radiate Onset quality:  Gradual Duration: since this AM. Timing:  Constant Progression:  Unchanged Chronicity:  New Context: not alcohol use, not eating, not recent illness, not sick contacts and not suspicious food intake (ate at [employer] last night but no one else was sick)   Relieved by:  None tried Worsened by:  Nothing tried Ineffective treatments:  None tried Associated symptoms: anorexia, chills, diarrhea (a little), flatus, hematemesis (?) and vomiting   Associated symptoms: no chest pain, no cough, no dysuria, no fever, no hematochezia, no hematuria, no melena, no nausea, no shortness of breath, no vaginal bleeding and no vaginal discharge   Diarrhea:    Number of occurrences:  Once today Vomiting:    Quality:  Stomach contents (someitme brown, last one was yellow)   Number of occurrences:  5-6 per hour Risk factors: multiple surgeries   Emesis Associated symptoms: abdominal pain, chills and diarrhea (a little)   Risk factors: no sick contacts    LMP 5 yrs ago, on Mirena. Pt says she is to have Mirena surgically removed because it is embedded and unable to manually be removed.    History reviewed. No pertinent past medical history. Past Surgical History  Procedure Laterality Date  . Cesarean section    . Appendectomy     History reviewed. No pertinent family history. History  Substance Use Topics  . Smoking status: Former Smoker -- 0.15 packs/day    Types: Cigarettes  . Smokeless tobacco: Not on file  . Alcohol Use: Yes     Comment: occ   OB  History   Grav Para Term Preterm Abortions TAB SAB Ect Mult Living                 Review of Systems  Constitutional: Positive for chills and appetite change (anorexia). Negative for fever.  Respiratory: Negative for cough, shortness of breath and wheezing.   Cardiovascular: Negative for chest pain.  Gastrointestinal: Positive for vomiting, abdominal pain, diarrhea (a little), anorexia, flatus and hematemesis (?). Negative for nausea, blood in stool, melena, hematochezia and abdominal distention.  Genitourinary: Positive for pelvic pain. Negative for dysuria, hematuria, flank pain, decreased urine volume, vaginal bleeding and vaginal discharge.  Musculoskeletal: Negative for back pain.  Skin: Negative for rash.  All other systems reviewed and are negative.   Allergies  Review of patient's allergies indicates no known allergies.  Home Medications   Prior to Admission medications   Medication Sig Start Date End Date Taking? Authorizing Provider  Ascorbic Acid (VITAMIN C PO) Take 1 tablet by mouth daily.    Historical Provider, MD  B Complex Vitamins (VITAMIN B COMPLEX PO) Take 1 tablet by mouth daily.    Historical Provider, MD  benzonatate (TESSALON PERLES) 100 MG capsule Take 1 capsule (100 mg total) by mouth 3 (three) times daily as needed for cough. 05/06/13   Mirian Mo, MD  Ibuprofen (IBU PO) Take 2 tablets by mouth every 6 (six) hours as needed (pain).  Historical Provider, MD  levonorgestrel (MIRENA) 20 MCG/24HR IUD 1 each by Intrauterine route once.    Historical Provider, MD  Multiple Vitamin (MULTIVITAMIN WITH MINERALS) TABS tablet Take 1 tablet by mouth daily.    Historical Provider, MD   BP 119/77  Pulse 87  Temp(Src) 98.3 F (36.8 C) (Oral)  Resp 16  SpO2 98% Physical Exam  Nursing note and vitals reviewed. Constitutional: She is oriented to person, place, and time. She appears well-developed and well-nourished.  Vomiting in room  HENT:  Head: Normocephalic  and atraumatic.  Nose: Nose normal.  Eyes: Conjunctivae are normal. Pupils are equal, round, and reactive to light.  Neck: Normal range of motion. Neck supple. No tracheal deviation present.  Cardiovascular: Normal rate, regular rhythm and normal heart sounds.   No murmur heard. Pulmonary/Chest: Effort normal and breath sounds normal. No respiratory distress. She has no rales.  Abdominal: Soft. Bowel sounds are normal. She exhibits no distension and no mass. There is no tenderness. There is no rebound and no guarding.  Neg CVAT.  Genitourinary: Pelvic exam was performed with patient prone. There is no lesion on the right labia. There is no lesion on the left labia. Cervix exhibits no motion tenderness, no discharge and no friability. Right adnexum displays no mass, no tenderness and no fullness. Left adnexum displays no mass, no tenderness and no fullness. No tenderness around the vagina. Vaginal discharge (scant white) found.  Musculoskeletal: Normal range of motion. She exhibits no edema.  Neurological: She is alert and oriented to person, place, and time.  Skin: Skin is warm and dry. No rash noted. She is not diaphoretic.    ED Course  Procedures (including critical care time) Labs Review Labs Reviewed  WET PREP, GENITAL - Abnormal; Notable for the following:    Clue Cells Wet Prep HPF POC FEW (*)    All other components within normal limits  CBC WITH DIFFERENTIAL - Abnormal; Notable for the following:    Neutrophils Relative % 86 (*)    Neutro Abs 8.6 (*)    Monocytes Relative 2 (*)    All other components within normal limits  COMPREHENSIVE METABOLIC PANEL - Abnormal; Notable for the following:    Glucose, Bld 111 (*)    Anion gap 16 (*)    All other components within normal limits  URINALYSIS, ROUTINE W REFLEX MICROSCOPIC - Abnormal; Notable for the following:    APPearance TURBID (*)    Leukocytes, UA TRACE (*)    All other components within normal limits  URINE  MICROSCOPIC-ADD ON - Abnormal; Notable for the following:    Squamous Epithelial / LPF FEW (*)    Bacteria, UA FEW (*)    All other components within normal limits  GC/CHLAMYDIA PROBE AMP  POC URINE PREG, ED    Imaging Review No results found.   EKG Interpretation None      MDM   Final diagnoses:  Nausea vomiting and diarrhea    Hx of appendectomy and C/s. N/v/d for one day. Gastroenteritis suspected as she had n/v/diarrhea.  Considered food poisoning but husband has eaten same food without sx.  GU exam unremarkable.  Torsion, Afebrile, doubt TOA. No signs of peritonitis to suggest surgical emergency.  UA without blood, doubt neophrolithiasis.  Blood in vomit from Mallory weis tear. No SOB or chest pain, doubt esophageal rupture.  Labs without metabolic derangement with exception of mild WAGMA, likely from vomiting.  UA without infectious markers, culture pending.   NS  bolus, zofran given. Pt symptoms improved and can now say that pelvic pain has resolved.   At discharge pt was completely asymptomatic. Abd exam soft NDNT. D/c with zofran     Sofie Rower, MD 04/24/14 [phone]

## 2014-04-23 NOTE — ED Notes (Signed)
Gave pt water per Dr. Fredderick Phenix. Informed RN.

## 2014-04-24 NOTE — ED Provider Notes (Signed)
I saw and evaluated the patient, reviewed the resident's note and I agree with the findings and plan.   EKG Interpretation None      Pt with n/v/d, intermittent abd cramping.  abd nontender on my exam.  Improved after fluids, meds.  Tolerating PO.  Rolan Bucco, MD 04/24/14 [phone]

## 2014-04-25 LAB — GC/CHLAMYDIA PROBE AMP
CT Probe RNA: NEGATIVE
GC PROBE AMP APTIMA: NEGATIVE

## 2014-05-02 ENCOUNTER — Encounter (HOSPITAL_COMMUNITY)
Admission: RE | Admit: 2014-05-02 | Discharge: 2014-05-02 | Disposition: A | Discharge status: Home / Self Care | Payer: 59 | Source: Ambulatory Visit | Attending: Obstetrics & Gynecology | Admitting: Obstetrics & Gynecology

## 2014-05-02 ENCOUNTER — Encounter (HOSPITAL_COMMUNITY): Payer: Self-pay

## 2014-05-02 DIAGNOSIS — T8389XA Other specified complication of genitourinary prosthetic devices, implants and grafts, initial encounter: Secondary | ICD-10-CM | POA: Insufficient documentation

## 2014-05-02 DIAGNOSIS — Z01812 Encounter for preprocedural laboratory examination: Secondary | ICD-10-CM | POA: Diagnosis present

## 2014-05-02 HISTORY — DX: Heartburn: R12

## 2014-05-02 LAB — CBC
HEMATOCRIT: 37.4 % (ref 36.0–46.0)
Hemoglobin: 12.3 g/dL (ref 12.0–15.0)
MCH: 29.5 pg (ref 26.0–34.0)
MCHC: 32.9 g/dL (ref 30.0–36.0)
MCV: 89.7 fL (ref 78.0–100.0)
PLATELETS: 253 10*3/uL (ref 150–400)
RBC: 4.17 MIL/uL (ref 3.87–5.11)
RDW: 12.7 % (ref 11.5–15.5)
WBC: 7.6 10*3/uL (ref 4.0–10.5)

## 2014-05-02 NOTE — Patient Instructions (Addendum)
   Your procedure is scheduled on: Friday, OCT 2  Enter through the Main Entrance of [facility] at: 6 am Pick up the phone at the desk and dial [phone] and inform us of your arrival.  Please call this number if you have any problems the morning of surgery: [phone]  Remember: Do not eat or drink after midnight: THURSDAY Take these medicines the morning of surgery with a SIP OF WATER:NONE  Do not wear jewelry, make-up, or FINGER nail polish No metal in your hair or on your body. Do not wear lotions, powders, perfumes.  You may wear deodorant.  Do not bring valuables to the hospital. Contacts, dentures or bridgework may not be worn into surgery.  Patients discharged on the day of surgery will not be allowed to drive home.  HOME WITH HUSBAND [facility] CELL [phone].

## 2014-05-03 NOTE — H&P (Signed)
Norton BlizzardJessica L Emmer is an 23 y.o. female with Mirena IUD imbedded in anterior uterus, ?C/S scar.  The patient did not tolerate in-office attempt at removal and wanted to have anesthesia for removal.  Strings are visible at the os but unable to remove.  Ultrasound suspicious for embedded IUD in scar.  Pertinent Gynecological History: Menses: irregular occurring approximately every 60 days with spotting approximately 3 days per month Bleeding: dysfunctional uterine bleeding Contraception: IUD and OCP (estrogen/progesterone) DES exposure: unknown Blood transfusions: none Sexually transmitted diseases: no past history Previous GYN Procedures: C/S x 2  Last mammogram: n/a Date: n/a Last pap: normal Date: 2014 OB History: G2, P2   Menstrual History: Menarche age: n/a No LMP recorded. Patient is not currently having periods (Reason: IUD).    Past Medical History  Diagnosis Date  . Heartburn     TUMS PRN    Past Surgical History  Procedure Laterality Date  . Cesarean section      X 2 WH  . Appendectomy      No family history on file.  Social History:  reports that she quit smoking about 8 months ago. Her smoking use included Cigarettes. She has a 1 pack-year smoking history. She has never used smokeless tobacco. She reports that she drinks alcohol. She reports that she uses illicit drugs (Marijuana).  Allergies: No Known Allergies  No prescriptions prior to admission    ROS  There were no vitals taken for this visit. Physical Exam  Constitutional: She is oriented to person, place, and time. She appears well-developed and well-nourished.  GI: Soft. There is no rebound and no guarding.  Neurological: She is alert and oriented to person, place, and time.  Skin: Skin is warm and dry.  Psychiatric: She has a normal mood and affect. Her behavior is normal.    No results found for this or any previous visit (from the past 24 hour(s)).  No results found.  Assessment/Plan: 23yo  G2P2 with embedded IUD -Hysteroscopic removal of IUD, possible operative laparoscopy  Shuronda Santino 05/03/2014, 8:22 PM

## 2014-05-06 ENCOUNTER — Encounter (HOSPITAL_COMMUNITY): Payer: Self-pay | Admitting: Anesthesiology

## 2014-05-06 ENCOUNTER — Ambulatory Visit (HOSPITAL_COMMUNITY)
Admission: RE | Admit: 2014-05-06 | Discharge: 2014-05-06 | Disposition: A | Discharge status: Home / Self Care | Payer: 59 | Source: Ambulatory Visit | Attending: Obstetrics & Gynecology | Admitting: Obstetrics & Gynecology

## 2014-05-06 ENCOUNTER — Encounter (HOSPITAL_COMMUNITY)
Admission: RE | Disposition: A | Discharge status: Home / Self Care | Payer: Self-pay | Source: Ambulatory Visit | Attending: Obstetrics & Gynecology

## 2014-05-06 DIAGNOSIS — R12 Heartburn: Secondary | ICD-10-CM | POA: Insufficient documentation

## 2014-05-06 DIAGNOSIS — T8332XA Displacement of intrauterine contraceptive device, initial encounter: Secondary | ICD-10-CM | POA: Insufficient documentation

## 2014-05-06 DIAGNOSIS — Y848 Other medical procedures as the cause of abnormal reaction of the patient, or of later complication, without mention of misadventure at the time of the procedure: Secondary | ICD-10-CM | POA: Insufficient documentation

## 2014-05-06 LAB — PREGNANCY, URINE: Preg Test, Ur: NEGATIVE

## 2014-05-06 SURGERY — DILATATION AND CURETTAGE /HYSTEROSCOPY
Anesthesia: Choice

## 2014-05-06 MED ORDER — SCOPOLAMINE 1 MG/3DAYS TD PT72: 1.0000 | MEDICATED_PATCH | Freq: Once | TRANSDERMAL | Status: DC

## 2014-05-06 MED ORDER — CEFAZOLIN SODIUM-DEXTROSE 2-3 GM-% IV SOLR: 2.0000 g | INTRAVENOUS | Status: DC

## 2014-05-06 MED ORDER — LACTATED RINGERS IV SOLN: INTRAVENOUS | Status: DC

## 2014-05-06 MED ORDER — DEXTROSE IN LACTATED RINGERS 5 % IV SOLN: INTRAVENOUS | Status: DC

## 2014-05-06 SURGICAL SUPPLY — 45 items
ABLATOR ENDOMETRIAL BIPOLAR (ABLATOR) IMPLANT
ADH SKN CLS APL DERMABOND .7 (GAUZE/BANDAGES/DRESSINGS)
BAG SPEC RTRVL LRG 6X4 10 (ENDOMECHANICALS)
BARRIER ADHS 3X4 INTERCEED (GAUZE/BANDAGES/DRESSINGS) IMPLANT
BLADE SURG 11 STRL SS (BLADE) ×1 IMPLANT
BRR ADH 4X3 ABS CNTRL BYND (GAUZE/BANDAGES/DRESSINGS)
CABLE HIGH FREQUENCY MONO STRZ (ELECTRODE) IMPLANT
CANISTER SUCT 3000ML (MISCELLANEOUS) ×1 IMPLANT
CATH ROBINSON RED A/P 16FR (CATHETERS) ×1 IMPLANT
CATH THERMACHOICE III (CATHETERS) IMPLANT
CHLORAPREP W/TINT 26ML (MISCELLANEOUS) ×1 IMPLANT
CLOTH BEACON ORANGE TIMEOUT ST (SAFETY) ×1 IMPLANT
CONTAINER PREFILL 10% NBF 60ML (FORM) ×2 IMPLANT
DERMABOND ADVANCED (GAUZE/BANDAGES/DRESSINGS)
DERMABOND ADVANCED .7 DNX12 (GAUZE/BANDAGES/DRESSINGS) ×1 IMPLANT
DRAPE HYSTEROSCOPY (DRAPE) ×1 IMPLANT
DRSG COVADERM PLUS 2X2 (GAUZE/BANDAGES/DRESSINGS) ×2 IMPLANT
DRSG OPSITE POSTOP 3X4 (GAUZE/BANDAGES/DRESSINGS) IMPLANT
ELECT REM PT RETURN 9FT ADLT (ELECTROSURGICAL)
ELECTRODE REM PT RTRN 9FT ADLT (ELECTROSURGICAL) ×1 IMPLANT
GLOVE BIO SURGEON STRL SZ 6 (GLOVE) IMPLANT
GLOVE BIOGEL PI IND STRL 6 (GLOVE) ×2 IMPLANT
GLOVE BIOGEL PI INDICATOR 6 (GLOVE)
GOWN STRL REUS W/TWL LRG LVL3 (GOWN DISPOSABLE) ×3 IMPLANT
LOOP ANGLED CUTTING 22FR (CUTTING LOOP) IMPLANT
NEEDLE INSUFFLATION 120MM (ENDOMECHANICALS) ×1 IMPLANT
NS IRRIG 1000ML POUR BTL (IV SOLUTION) ×1 IMPLANT
PACK LAPAROSCOPY BASIN (CUSTOM PROCEDURE TRAY) ×1 IMPLANT
PACK VAGINAL MINOR WOMEN LF (CUSTOM PROCEDURE TRAY) ×1 IMPLANT
PAD OB MATERNITY 4.3X12.25 (PERSONAL CARE ITEMS) ×1 IMPLANT
POUCH SPECIMEN RETRIEVAL 10MM (ENDOMECHANICALS) IMPLANT
PROTECTOR NERVE ULNAR (MISCELLANEOUS) ×1 IMPLANT
SEALER TISSUE G2 CVD JAW 35 (ENDOMECHANICALS) IMPLANT
SEALER TISSUE G2 CVD JAW 45CM (ENDOMECHANICALS) IMPLANT
SET IRRIG TUBING LAPAROSCOPIC (IRRIGATION / IRRIGATOR) IMPLANT
SET TUBING HYSTEROSCOPY 2 NDL (TUBING) IMPLANT
STRIP CLOSURE SKIN 1/2X4 (GAUZE/BANDAGES/DRESSINGS) IMPLANT
SUT MNCRL AB 3-0 PS2 27 (SUTURE) ×1 IMPLANT
SUT VICRYL 0 UR6 27IN ABS (SUTURE) IMPLANT
TOWEL OR 17X24 6PK STRL BLUE (TOWEL DISPOSABLE) ×2 IMPLANT
TROCAR XCEL NON-BLD 11X100MML (ENDOMECHANICALS) ×1 IMPLANT
TROCAR XCEL NON-BLD 5MMX100MML (ENDOMECHANICALS) ×2 IMPLANT
TUBE HYSTEROSCOPY W Y-CONNECT (TUBING) IMPLANT
WARMER LAPAROSCOPE (MISCELLANEOUS) ×1 IMPLANT
WATER STERILE IRR 1000ML POUR (IV SOLUTION) ×1 IMPLANT

## 2014-05-06 NOTE — Progress Notes (Signed)
Pt reports that she had 3 chips after midnight. "I didn't realize what time it was." Anesthesia called. Case cancelled. Message left for Dr. Langston MaskerMorris. OR notified.

## 2014-05-09 ENCOUNTER — Ambulatory Visit (HOSPITAL_COMMUNITY)
Admission: RE | Admit: 2014-05-09 | Discharge: 2014-05-09 | Disposition: A | Discharge status: Home / Self Care | Payer: 59 | Source: Ambulatory Visit | Attending: Obstetrics & Gynecology | Admitting: Obstetrics & Gynecology

## 2014-05-09 ENCOUNTER — Encounter (HOSPITAL_COMMUNITY): Payer: 59 | Admitting: Anesthesiology

## 2014-05-09 ENCOUNTER — Encounter (HOSPITAL_COMMUNITY)
Admission: RE | Disposition: A | Discharge status: Home / Self Care | Payer: Self-pay | Source: Ambulatory Visit | Attending: Obstetrics & Gynecology

## 2014-05-09 ENCOUNTER — Ambulatory Visit (HOSPITAL_COMMUNITY): Payer: 59 | Admitting: Anesthesiology

## 2014-05-09 ENCOUNTER — Encounter (HOSPITAL_COMMUNITY): Payer: Self-pay | Admitting: Registered Nurse

## 2014-05-09 DIAGNOSIS — T8389XA Other specified complication of genitourinary prosthetic devices, implants and grafts, initial encounter: Secondary | ICD-10-CM | POA: Diagnosis not present

## 2014-05-09 DIAGNOSIS — Y768 Miscellaneous obstetric and gynecological devices associated with adverse incidents, not elsewhere classified: Secondary | ICD-10-CM | POA: Insufficient documentation

## 2014-05-09 HISTORY — PX: HYSTEROSCOPY: SHX211

## 2014-05-09 LAB — PREGNANCY, URINE: Preg Test, Ur: NEGATIVE

## 2014-05-09 SURGERY — HYSTEROSCOPY
Anesthesia: General

## 2014-05-09 MED ORDER — OXYCODONE-ACETAMINOPHEN 5-325 MG PO TABS
ORAL_TABLET | ORAL | Status: AC
  Filled 2014-05-09: qty 1

## 2014-05-09 MED ORDER — LACTATED RINGERS IV SOLN
INTRAVENOUS | Status: DC
  Administered 2014-05-09: 12:00:00 via INTRAVENOUS

## 2014-05-09 MED ORDER — FENTANYL CITRATE 0.05 MG/ML IJ SOLN
INTRAMUSCULAR | Status: AC
  Filled 2014-05-09: qty 2

## 2014-05-09 MED ORDER — DEXAMETHASONE SODIUM PHOSPHATE 4 MG/ML IJ SOLN
INTRAMUSCULAR | Status: DC | PRN
  Administered 2014-05-09: 4 mg via INTRAVENOUS

## 2014-05-09 MED ORDER — SCOPOLAMINE 1 MG/3DAYS TD PT72
MEDICATED_PATCH | TRANSDERMAL | Status: AC
  Filled 2014-05-09: qty 1

## 2014-05-09 MED ORDER — BUPIVACAINE HCL (PF) 0.5 % IJ SOLN
INTRAMUSCULAR | Status: AC
  Filled 2014-05-09: qty 30

## 2014-05-09 MED ORDER — KETOROLAC TROMETHAMINE 30 MG/ML IJ SOLN
INTRAMUSCULAR | Status: DC | PRN
  Administered 2014-05-09: 30 mg via INTRAVENOUS

## 2014-05-09 MED ORDER — PROPOFOL 10 MG/ML IV EMUL
INTRAVENOUS | Status: AC
  Filled 2014-05-09: qty 20

## 2014-05-09 MED ORDER — DEXAMETHASONE SODIUM PHOSPHATE 4 MG/ML IJ SOLN
INTRAMUSCULAR | Status: AC
  Filled 2014-05-09: qty 1

## 2014-05-09 MED ORDER — IBUPROFEN 600 MG PO TABS: 600.0000 mg | ORAL_TABLET | Freq: Four times a day (QID) | ORAL | Status: DC | PRN

## 2014-05-09 MED ORDER — ONDANSETRON HCL 4 MG/2ML IJ SOLN
INTRAMUSCULAR | Status: DC | PRN
  Administered 2014-05-09: 4 mg via INTRAVENOUS

## 2014-05-09 MED ORDER — SCOPOLAMINE 1 MG/3DAYS TD PT72
1.0000 | MEDICATED_PATCH | Freq: Once | TRANSDERMAL | Status: DC
  Administered 2014-05-09: 1.5 mg via TRANSDERMAL

## 2014-05-09 MED ORDER — MIDAZOLAM HCL 5 MG/5ML IJ SOLN
INTRAMUSCULAR | Status: DC | PRN
  Administered 2014-05-09: 2 mg via INTRAVENOUS

## 2014-05-09 MED ORDER — LIDOCAINE HCL 1 % IJ SOLN
INTRAMUSCULAR | Status: AC
  Filled 2014-05-09: qty 20

## 2014-05-09 MED ORDER — MIDAZOLAM HCL 2 MG/2ML IJ SOLN
INTRAMUSCULAR | Status: AC
  Filled 2014-05-09: qty 2

## 2014-05-09 MED ORDER — FENTANYL CITRATE 0.05 MG/ML IJ SOLN
INTRAMUSCULAR | Status: DC | PRN
  Administered 2014-05-09: 50 ug via INTRAVENOUS
  Administered 2014-05-09 (×2): 25 ug via INTRAVENOUS

## 2014-05-09 MED ORDER — KETOROLAC TROMETHAMINE 30 MG/ML IJ SOLN
INTRAMUSCULAR | Status: AC
  Filled 2014-05-09: qty 1

## 2014-05-09 MED ORDER — OXYCODONE-ACETAMINOPHEN 5-325 MG PO TABS
1.0000 | ORAL_TABLET | ORAL | Status: DC | PRN
  Administered 2014-05-09: 1 via ORAL

## 2014-05-09 MED ORDER — GLYCINE 1.5 % IR SOLN
Status: DC | PRN
  Administered 2014-05-09: 3000 mL

## 2014-05-09 MED ORDER — PROPOFOL 10 MG/ML IV BOLUS
INTRAVENOUS | Status: DC | PRN
  Administered 2014-05-09: 200 mg via INTRAVENOUS

## 2014-05-09 MED ORDER — ONDANSETRON HCL 4 MG/2ML IJ SOLN
INTRAMUSCULAR | Status: AC
  Filled 2014-05-09: qty 2

## 2014-05-09 MED ORDER — LIDOCAINE HCL (CARDIAC) 20 MG/ML IV SOLN
INTRAVENOUS | Status: AC
  Filled 2014-05-09: qty 5

## 2014-05-09 MED ORDER — LIDOCAINE HCL (CARDIAC) 20 MG/ML IV SOLN
INTRAVENOUS | Status: DC | PRN
  Administered 2014-05-09: 50 mg via INTRAVENOUS

## 2014-05-09 SURGICAL SUPPLY — 22 items
ABLATOR ENDOMETRIAL BIPOLAR (ABLATOR) IMPLANT
CANISTER SUCT 3000ML (MISCELLANEOUS) ×2 IMPLANT
CATH ROBINSON RED A/P 16FR (CATHETERS) ×2 IMPLANT
CATH THERMACHOICE III (CATHETERS) IMPLANT
CLOTH BEACON ORANGE TIMEOUT ST (SAFETY) ×2 IMPLANT
CONTAINER PREFILL 10% NBF 60ML (FORM) ×4 IMPLANT
DRAPE HYSTEROSCOPY (DRAPE) ×2 IMPLANT
ELECT REM PT RETURN 9FT ADLT (ELECTROSURGICAL) ×2
ELECTRODE REM PT RTRN 9FT ADLT (ELECTROSURGICAL) ×1 IMPLANT
GLOVE BIO SURGEON STRL SZ 6 (GLOVE) ×2 IMPLANT
GLOVE BIOGEL PI IND STRL 6 (GLOVE) ×2 IMPLANT
GLOVE BIOGEL PI INDICATOR 6 (GLOVE) ×2
GOWN STRL REUS W/TWL LRG LVL3 (GOWN DISPOSABLE) ×4 IMPLANT
LOOP ANGLED CUTTING 22FR (CUTTING LOOP) IMPLANT
NDL SPNL 22GX3.5 QUINCKE BK (NEEDLE) ×1 IMPLANT
NEEDLE SPNL 22GX3.5 QUINCKE BK (NEEDLE) ×2 IMPLANT
PACK VAGINAL MINOR WOMEN LF (CUSTOM PROCEDURE TRAY) ×2 IMPLANT
PAD OB MATERNITY 4.3X12.25 (PERSONAL CARE ITEMS) ×2 IMPLANT
SET TUBING HYSTEROSCOPY 2 NDL (TUBING) ×1 IMPLANT
TOWEL OR 17X24 6PK STRL BLUE (TOWEL DISPOSABLE) ×4 IMPLANT
TUBE HYSTEROSCOPY W Y-CONNECT (TUBING) ×2 IMPLANT
WATER STERILE IRR 1000ML POUR (IV SOLUTION) ×2 IMPLANT

## 2014-05-09 NOTE — Anesthesia Preprocedure Evaluation (Signed)
Anesthesia Evaluation  Patient identified by MRN, date of birth, ID band Patient awake    Reviewed: Allergy & Precautions, H&P , Patient's Chart, lab work & pertinent test results, reviewed documented beta blocker date and time   History of Anesthesia Complications Negative for: history of anesthetic complications  Airway Mallampati: II  TM Distance: >3 FB Neck ROM: full    Dental   Pulmonary former smoker,  breath sounds clear to auscultation        Cardiovascular Exercise Tolerance: Good Rhythm:regular Rate:Normal     Neuro/Psych negative psych ROS   GI/Hepatic   Endo/Other    Renal/GU      Musculoskeletal   Abdominal   Peds  Hematology   Anesthesia Other Findings   Reproductive/Obstetrics                             Anesthesia Physical Anesthesia Plan  ASA: I  Anesthesia Plan: General LMA   Post-op Pain Management:    Induction:   Airway Management Planned:   Additional Equipment:   Intra-op Plan:   Post-operative Plan:   Informed Consent: I have reviewed the patients History and Physical, chart, labs and discussed the procedure including the risks, benefits and alternatives for the proposed anesthesia with the patient or authorized representative who has indicated his/her understanding and acceptance.   Dental Advisory Given  Plan Discussed with: CRNA, Surgeon and Anesthesiologist  Anesthesia Plan Comments:         Anesthesia Quick Evaluation  

## 2014-05-09 NOTE — Anesthesia Postprocedure Evaluation (Signed)
  Anesthesia Post-op Note  Anesthesia Post Note  Patient: [name]  Procedure(s) Performed: Procedure(s) (LRB): HYSTEROSCOPY WITH REMOVAL OF IUD; POSSIBLE LAPAROSCOPY (N/A)  Anesthesia type: General  Patient location: PACU  Post pain: Pain level controlled  Post assessment: Post-op Vital signs reviewed  Last Vitals:  Filed Vitals:   05/09/14 1400  BP: 105/51  Pulse: 55  Temp: 36.6 C  Resp: 21    Post vital signs: Reviewed  Level of consciousness: sedated  Complications: No apparent anesthesia complications

## 2014-05-09 NOTE — Transfer of Care (Signed)
Immediate Anesthesia Transfer of Care Note  Patient: [name]  Procedure(s) Performed: Procedure(s): HYSTEROSCOPY WITH REMOVAL OF IUD; POSSIBLE LAPAROSCOPY (N/A)  Patient Location: PACU  Anesthesia Type:General  Level of Consciousness: awake, alert  and oriented  Airway & Oxygen Therapy: Patient Spontanous Breathing and Patient connected to nasal cannula oxygen  Post-op Assessment: Report given to PACU RN  Post vital signs: Reviewed  Complications: No apparent anesthesia complications

## 2014-05-09 NOTE — Op Note (Signed)
PREOPERATIVE DIAGNOSIS:  23 y.o. with embedded IUD  POSTOPERATIVE DIAGNOSIS: The same  PROCEDURE: Operative hysteroscopy, IUD removal  SURGEON:  Dr. Mitchel HonourMegan Cooper Stamp  INDICATIONS: 23 y.o. G2P2 here for scheduled surgery to remove embedded IUD.   Risks of surgery were discussed with the patient including but not limited to: bleeding which may require transfusion; infection which may require antibiotics; injury to uterus or surrounding organs; intrauterine scarring which may impair future fertility; need for additional procedures including laparotomy or laparoscopy; and other postoperative/anesthesia complications. Written informed consent was obtained.    FINDINGS:  IUD completely contained within cavity; base scarred into lower uterine segment.  Thin endometrium Normal ostia bilaterally.  Normal appearing cavity after IUD removal.  ANESTHESIA:   General  ESTIMATED BLOOD LOSS:  Less than 20 ml  SPECIMENS: None  COMPLICATIONS:  None immediate.  PROCEDURE DETAILS:  The patient was taken to the operating room where general anesthesia was administered and was found to be adequate.  After an adequate timeout was performed, she was placed in the dorsal lithotomy position and examined; then prepped and draped in the sterile manner.   Her bladder was catheterized for an unmeasured amount of clear, yellow urine. A speculum was then placed in the patient's vagina and a single tooth tenaculum was applied to the anterior lip of the cervix.  The 5.5 mm diagnostic hysteroscope was inserted under direct visualization using saline as a suspension medium.  The uterine cavity was carefully examined and the above described findings were noted.   An hysteroscopic grasper was used to grasp the IUD but did not successfully remove the device.  The hysteroscope was removed under direct visualization.  A ring forceps was used to grasp the IUD strings and with steady traction, did deliver the IUD.  A final hysteroscopic look  was taken and revealed normal internal uterine anatomy.  The tenaculum was removed from the anterior lip of the cervix and the vaginal speculum was removed after noting good hemostasis.  The patient tolerated the procedure well and was taken to the recovery area awake, extubated and in stable condition.

## 2014-05-09 NOTE — Progress Notes (Signed)
No change to H&P. 

## 2014-05-09 NOTE — Discharge Instructions (Signed)
Call MD for T>100.4, heavy vaginal bleeding, severe abdominal pain, or symptoms of infection.  Call office to schedule postop exam in 2 weeks.  DISCHARGE   INSTRUCTIONS: IUD Removal  The following instructions have been prepared to help you care for yourself upon your return home.  NO IBUPROFEN (MOTRIN, ADVIL) OR ALEVE AFTER 7:25 PM FOR CRAMPS!!!   Personal hygiene:  Use sanitary pads for vaginal drainage, not tampons.  Shower the day after your procedure.  NO tub baths, pools or Jacuzzis for 2-3 weeks.  Wipe front to back after using the bathroom.  Activity and limitations:  Do NOT drive or operate any equipment for 24 hours. The effects of anesthesia are still present and drowsiness may result.  Do NOT rest in bed all day.  Walking is encouraged.  Walk up and down stairs slowly.  You may resume your normal activity in one to two days or as indicated by your physician.  Sexual activity: NO intercourse for at least 2 weeks after the procedure, or as indicated by your physician.  Diet: Eat a light meal as desired this evening. You may resume your usual diet tomorrow.  Return to work: You may resume your work activities in one to two days or as indicated by your doctor.  What to expect after your surgery: Expect to have vaginal bleeding/discharge for 2-3 days and spotting for up to 10 days. It is not unusual to have soreness for up to 1-2 weeks. You may have a slight burning sensation when you urinate for the first day. Mild cramps may continue for a couple of days. You may have a regular period in 2-6 weeks.  Call your doctor for any of the following:  Excessive vaginal bleeding, saturating and changing one pad every hour.  Inability to urinate 6 hours after discharge from hospital.  Pain not relieved by pain medication.  Fever of 100.4 F or greater.  Unusual vaginal discharge or odor.   Call for an appointment:    Patients signature:  ______________________  Nurses signature ________________________  Support person's signature_______________________

## 2014-05-10 ENCOUNTER — Encounter (HOSPITAL_COMMUNITY): Payer: Self-pay | Admitting: Obstetrics & Gynecology

## 2014-08-18 ENCOUNTER — Encounter (HOSPITAL_COMMUNITY): Payer: Self-pay | Admitting: Obstetrics & Gynecology

## 2016-11-10 ENCOUNTER — Emergency Department (HOSPITAL_COMMUNITY)
Admission: EM | Admit: 2016-11-10 | Discharge: 2016-11-10 | Disposition: A | Discharge status: Home / Self Care | Payer: Managed Care, Other (non HMO) | Attending: Emergency Medicine | Admitting: Emergency Medicine

## 2016-11-10 ENCOUNTER — Emergency Department (HOSPITAL_COMMUNITY): Payer: Managed Care, Other (non HMO)

## 2016-11-10 ENCOUNTER — Encounter (HOSPITAL_COMMUNITY): Payer: Self-pay | Admitting: Emergency Medicine

## 2016-11-10 DIAGNOSIS — Y999 Unspecified external cause status: Secondary | ICD-10-CM | POA: Insufficient documentation

## 2016-11-10 DIAGNOSIS — Y92410 Unspecified street and highway as the place of occurrence of the external cause: Secondary | ICD-10-CM | POA: Insufficient documentation

## 2016-11-10 DIAGNOSIS — Z87891 Personal history of nicotine dependence: Secondary | ICD-10-CM | POA: Diagnosis not present

## 2016-11-10 DIAGNOSIS — S299XXA Unspecified injury of thorax, initial encounter: Secondary | ICD-10-CM | POA: Diagnosis present

## 2016-11-10 DIAGNOSIS — S22000A Wedge compression fracture of unspecified thoracic vertebra, initial encounter for closed fracture: Secondary | ICD-10-CM

## 2016-11-10 DIAGNOSIS — M542 Cervicalgia: Secondary | ICD-10-CM | POA: Diagnosis not present

## 2016-11-10 DIAGNOSIS — Y939 Activity, unspecified: Secondary | ICD-10-CM | POA: Diagnosis not present

## 2016-11-10 DIAGNOSIS — R51 Headache: Secondary | ICD-10-CM | POA: Diagnosis not present

## 2016-11-10 DIAGNOSIS — S42101A Fracture of unspecified part of scapula, right shoulder, initial encounter for closed fracture: Secondary | ICD-10-CM

## 2016-11-10 DIAGNOSIS — S22050A Wedge compression fracture of T5-T6 vertebra, initial encounter for closed fracture: Secondary | ICD-10-CM | POA: Insufficient documentation

## 2016-11-10 DIAGNOSIS — S42111A Displaced fracture of body of scapula, right shoulder, initial encounter for closed fracture: Secondary | ICD-10-CM | POA: Diagnosis not present

## 2016-11-10 LAB — I-STAT CHEM 8, ED
BUN: 11 mg/dL (ref 6–20)
CALCIUM ION: 1.11 mmol/L — AB (ref 1.15–1.40)
Chloride: 105 mmol/L (ref 101–111)
Creatinine, Ser: 0.7 mg/dL (ref 0.44–1.00)
Glucose, Bld: 101 mg/dL — ABNORMAL HIGH (ref 65–99)
HEMATOCRIT: 37 % (ref 36.0–46.0)
HEMOGLOBIN: 12.6 g/dL (ref 12.0–15.0)
Potassium: 3.7 mmol/L (ref 3.5–5.1)
SODIUM: 138 mmol/L (ref 135–145)
TCO2: 24 mmol/L (ref 0–100)

## 2016-11-10 LAB — I-STAT BETA HCG BLOOD, ED (MC, WL, AP ONLY): I-stat hCG, quantitative: <5 m[IU]/mL (ref <5)

## 2016-11-10 MED ORDER — IOPAMIDOL (ISOVUE-300) INJECTION 61%
INTRAVENOUS | Status: AC
  Administered 2016-11-10: 75 mL
  Filled 2016-11-10: qty 75

## 2016-11-10 MED ORDER — OXYCODONE-ACETAMINOPHEN 5-325 MG PO TABS
2.0000 | ORAL_TABLET | Freq: Once | ORAL | Status: AC
  Administered 2016-11-10: 2 via ORAL
  Filled 2016-11-10: qty 2

## 2016-11-10 MED ORDER — OXYCODONE-ACETAMINOPHEN 5-325 MG PO TABS: 1.0000 | ORAL_TABLET | ORAL | 0 refills | Status: DC | PRN

## 2016-11-10 MED ORDER — IBUPROFEN 800 MG PO TABS
800.0000 mg | ORAL_TABLET | Freq: Once | ORAL | Status: AC
  Administered 2016-11-10: 800 mg via ORAL
  Filled 2016-11-10: qty 1

## 2016-11-10 MED ORDER — MORPHINE SULFATE (PF) 4 MG/ML IV SOLN
2.0000 mg | Freq: Once | INTRAVENOUS | Status: AC
  Administered 2016-11-10: 2 mg via INTRAVENOUS
  Filled 2016-11-10: qty 1

## 2016-11-10 NOTE — ED Notes (Signed)
Patient transported to X-ray 
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
## 2016-11-10 NOTE — Progress Notes (Signed)
Orthopedic Tech Progress Note Patient Details:  [name] 1991-01-28 [id number]  Ortho Devices Type of Ortho Device: Shoulder immobilizer   Saul Fordyce 11/10/2016, 2:49 PM

## 2016-11-10 NOTE — ED Notes (Signed)
Dinner tray ordered; heart healthy diet 
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
## 2016-11-10 NOTE — Discharge Instructions (Signed)
Call Dr. Warren Danes office in the morning for follow up this week.

## 2016-11-10 NOTE — ED Notes (Signed)
Pt to CT

## 2016-11-10 NOTE — ED Notes (Signed)
Dr Ray at bedside. 

## 2016-11-11 ENCOUNTER — Ambulatory Visit (INDEPENDENT_AMBULATORY_CARE_PROVIDER_SITE_OTHER): Payer: Managed Care, Other (non HMO) | Admitting: Orthopaedic Surgery

## 2016-11-11 ENCOUNTER — Encounter (INDEPENDENT_AMBULATORY_CARE_PROVIDER_SITE_OTHER): Payer: Self-pay | Admitting: Orthopaedic Surgery

## 2016-11-11 DIAGNOSIS — S42111A Displaced fracture of body of scapula, right shoulder, initial encounter for closed fracture: Secondary | ICD-10-CM
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
## 2016-12-23 ENCOUNTER — Ambulatory Visit (INDEPENDENT_AMBULATORY_CARE_PROVIDER_SITE_OTHER): Payer: Managed Care, Other (non HMO) | Admitting: Orthopaedic Surgery

## 2016-12-24 ENCOUNTER — Encounter (INDEPENDENT_AMBULATORY_CARE_PROVIDER_SITE_OTHER): Payer: Self-pay

## 2016-12-24 ENCOUNTER — Encounter (INDEPENDENT_AMBULATORY_CARE_PROVIDER_SITE_OTHER): Payer: Self-pay | Admitting: Orthopaedic Surgery

## 2016-12-24 ENCOUNTER — Ambulatory Visit (INDEPENDENT_AMBULATORY_CARE_PROVIDER_SITE_OTHER): Payer: Self-pay | Admitting: Orthopaedic Surgery

## 2016-12-24 DIAGNOSIS — S42111A Displaced fracture of body of scapula, right shoulder, initial encounter for closed fracture: Secondary | ICD-10-CM
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
## 2017-01-29 ENCOUNTER — Inpatient Hospital Stay (HOSPITAL_COMMUNITY): Payer: Managed Care, Other (non HMO)

## 2017-01-29 ENCOUNTER — Inpatient Hospital Stay (HOSPITAL_COMMUNITY)
Admission: AD | Admit: 2017-01-29 | Discharge: 2017-01-29 | Disposition: A | Discharge status: Home / Self Care | Payer: Managed Care, Other (non HMO) | Source: Ambulatory Visit | Attending: Family Medicine | Admitting: Family Medicine

## 2017-01-29 ENCOUNTER — Encounter (HOSPITAL_COMMUNITY): Payer: Self-pay

## 2017-01-29 DIAGNOSIS — Z87891 Personal history of nicotine dependence: Secondary | ICD-10-CM | POA: Insufficient documentation

## 2017-01-29 DIAGNOSIS — O209 Hemorrhage in early pregnancy, unspecified: Secondary | ICD-10-CM | POA: Diagnosis not present

## 2017-01-29 DIAGNOSIS — O3680X0 Pregnancy with inconclusive fetal viability, not applicable or unspecified: Secondary | ICD-10-CM | POA: Diagnosis not present

## 2017-01-29 DIAGNOSIS — Z3A01 Less than 8 weeks gestation of pregnancy: Secondary | ICD-10-CM | POA: Diagnosis present

## 2017-01-29 HISTORY — DX: Migraine, unspecified, not intractable, without status migrainosus: G43.909

## 2017-01-29 LAB — URINALYSIS, ROUTINE W REFLEX MICROSCOPIC
Bilirubin Urine: NEGATIVE
GLUCOSE, UA: NEGATIVE mg/dL
Hgb urine dipstick: NEGATIVE
Ketones, ur: NEGATIVE mg/dL
LEUKOCYTES UA: NEGATIVE
Nitrite: NEGATIVE
PH: 6 (ref 5.0–8.0)
PROTEIN: NEGATIVE mg/dL
Specific Gravity, Urine: 1.008 (ref 1.005–1.030)

## 2017-01-29 LAB — CBC
HCT: 38.7 % (ref 36.0–46.0)
Hemoglobin: 12.8 g/dL (ref 12.0–15.0)
MCH: 29.9 pg (ref 26.0–34.0)
MCHC: 33.1 g/dL (ref 30.0–36.0)
MCV: 90.4 fL (ref 78.0–100.0)
PLATELETS: 274 10*3/uL (ref 150–400)
RBC: 4.28 MIL/uL (ref 3.87–5.11)
RDW: 13.6 % (ref 11.5–15.5)
WBC: 7.2 10*3/uL (ref 4.0–10.5)

## 2017-01-29 LAB — WET PREP, GENITAL
CLUE CELLS WET PREP: NONE SEEN
SPERM: NONE SEEN
TRICH WET PREP: NONE SEEN

## 2017-01-29 LAB — HCG, QUANTITATIVE, PREGNANCY: hCG, Beta Chain, Quant, S: 149 m[IU]/mL — ABNORMAL HIGH (ref <5)

## 2017-01-29 LAB — POCT PREGNANCY, URINE: Preg Test, Ur: POSITIVE — AB

## 2017-01-29 LAB — ABO/RH: ABO/RH(D): A POS

## 2017-01-29 NOTE — MAU Note (Signed)
Pt with positive HPT on 01/28/2017 with last LMP on 12/19/2016 present to MAU today with concerns of bleeding after BM last night with mild cramping.  Bleeding was only seen in toilet and no other bleeding seen since.  Pt currently has no bleeding, no cramping an not other concerns.

## 2017-01-29 NOTE — Progress Notes (Signed)
Pt Dc home with discharge instructions including bleeding in early pregnancy.  Pt verbalizes understanding.  Pt given instructions to follow up in clinic in 48 hours.

## 2017-01-29 NOTE — MAU Provider Note (Signed)
Chief Complaint: Threatened Miscarriage   First Provider Initiated Contact with Patient 01/29/17 0840        SUBJECTIVE HPI: [name] is a 26 y.o. G3P2002 at 4377w6d by LMP who presents to maternity admissions reporting bleeding seen after a bowel movement.  She has some mild pelvic cramping also.  No further bleeding. She denies vaginal itching/burning, urinary symptoms, h/a, dizziness, n/v, or fever/chills.     Vaginal Bleeding  The patient's primary symptoms include missed menses and vaginal bleeding. The patient's pertinent negatives include no genital itching, genital lesions, genital odor or pelvic pain. This is a new problem. The current episode started today. The problem occurs intermittently. The problem has been resolved. The pain is mild. The problem affects both sides. She is pregnant. Associated symptoms include abdominal pain. Pertinent negatives include no chills, constipation, diarrhea, fever, nausea or vomiting. The vaginal discharge was bloody. The vaginal bleeding is lighter than menses. She has not been passing clots. She has not been passing tissue. Nothing aggravates the symptoms. She has tried nothing for the symptoms.    RN note: Pt with positive HPT on 01/28/2017 with last LMP on 12/19/2016 present to MAU today with concerns of bleeding after BM last night with mild cramping.  Bleeding was only seen in toilet and no other bleeding seen since.  Pt currently has no bleeding, no cramping an not other concerns.      Electronically signed by Oletta LamasPolos, Abigail, RN at 01/29/2017 8:01 AM       Past Medical History:  Diagnosis Date  . Heartburn    TUMS PRN  . Migraines    Past Surgical History:  Procedure Laterality Date  . APPENDECTOMY    . CESAREAN SECTION     X 2 WH  . HYSTEROSCOPY N/A 05/09/2014   Procedure: HYSTEROSCOPY WITH REMOVAL OF IUD; POSSIBLE LAPAROSCOPY;  Surgeon: Mitchel HonourMegan Morris, DO;  Location: [facility];  Service: Gynecology;  Laterality: N/A;   Social  History   Social History  . Marital status: Married    Spouse name: N/A  . Number of children: N/A  . Years of education: N/A   Occupational History  . Not on file.   Social History Main Topics  . Smoking status: Former Smoker    Packs/day: 0.25    Years: 4.00    Types: Cigarettes    Quit date: 08/05/2013  . Smokeless tobacco: Never Used  . Alcohol use Yes     Comment: DAILY - MIXED DRINK 1 PER NIGHT  . Drug use: Yes    Types: Marijuana     Comment: LAST USE 04/16/14  . Sexual activity: Yes    Birth control/ protection: IUD   Other Topics Concern  . Not on file   Social History Narrative  . No narrative on file   No current facility-administered medications on file prior to encounter.    Current Outpatient Prescriptions on File Prior to Encounter  Medication Sig Dispense Refill  . acetaminophen (TYLENOL) 325 MG tablet Take 650 mg by mouth every 6 (six) hours as needed for mild pain or headache.     . ibuprofen (ADVIL) 600 MG tablet Take 1 tablet (600 mg total) by mouth every 6 (six) hours as needed. 30 tablet 0  . Multiple Vitamin (MULTIVITAMIN WITH MINERALS) TABS tablet Take 1 tablet by mouth daily.    Marland Kitchen. oxyCODONE-acetaminophen (PERCOCET/ROXICET) 5-325 MG tablet Take 1 tablet by mouth every 4 (four) hours as needed for severe pain. (Patient not taking:  Reported on 12/24/2016) 15 tablet 0   No Known Allergies  I have reviewed patient's Past Medical Hx, Surgical Hx, Family Hx, Social Hx, medications and allergies.   ROS:  Review of Systems  Constitutional: Negative for chills and fever.  Gastrointestinal: Positive for abdominal pain. Negative for constipation, diarrhea, nausea and vomiting.  Genitourinary: Positive for missed menses and vaginal bleeding. Negative for pelvic pain.   Review of Systems  Other systems negative   Physical Exam  Physical Exam Patient Vitals for the past 24 hrs:  BP Temp Temp src Pulse SpO2 Height Weight  01/29/17 0807 122/79 98.8 F  (37.1 C) Oral 77 98 % - -  01/29/17 0801 - - - - - 5\' 2"  (1.575 m) 119 lb 6.4 oz (54.2 kg)   Constitutional: Well-developed, well-nourished female in no acute distress.  Cardiovascular: normal rate Respiratory: normal effort GI: Abd soft, non-tender. Pos BS x 4 MS: Extremities nontender, no edema, normal ROM Neurologic: Alert and oriented x 4.  GU: Neg CVAT.  PELVIC EXAM: Cervix pink, visually closed, without lesion, scant white creamy discharge, NO BLOOD, vaginal walls and external genitalia normal Bimanual exam: Cervix 0/long/high, firm, anterior, neg CMT, uterus nontender, nonenlarged, adnexa without tenderness, enlargement, or mass   LAB RESULTS Results for orders placed or performed during the hospital encounter of 01/29/17 (from the past 24 hour(s))  Urinalysis, Routine w reflex microscopic     Status: None   Collection Time: 01/29/17  8:00 AM  Result Value Ref Range   Color, Urine YELLOW YELLOW   APPearance CLEAR CLEAR   Specific Gravity, Urine 1.008 1.005 - 1.030   pH 6.0 5.0 - 8.0   Glucose, UA NEGATIVE NEGATIVE mg/dL   Hgb urine dipstick NEGATIVE NEGATIVE   Bilirubin Urine NEGATIVE NEGATIVE   Ketones, ur NEGATIVE NEGATIVE mg/dL   Protein, ur NEGATIVE NEGATIVE mg/dL   Nitrite NEGATIVE NEGATIVE   Leukocytes, UA NEGATIVE NEGATIVE  Pregnancy, urine POC     Status: Abnormal   Collection Time: 01/29/17  8:04 AM  Result Value Ref Range   Preg Test, Ur POSITIVE (A) NEGATIVE       IMAGING No results found.  MAU Management/MDM: Ordered usual first trimester r/o ectopic labs.   Pelvic exam and cultures done Will check baseline Ultrasound to rule out ectopic.  This bleeding/pain can represent a normal pregnancy with bleeding, spontaneous abortion or even an ectopic which can be life-threatening.  The process as listed above helps to determine which of these is present.  HCG was low, possibly reflecting early gestation or abnormal gestation.  Discussed with patient this  is why we cannot see anything on [place].  WIll recommend repeating HCG Friday to look for doubling.  Then repeat US in 10 days  ASSESSMENT Bleeding in early pregnancy - Plan: US OB Comp Less 14 Wks, US OB Transvaginal, US OB Comp Less 14 Wks, US OB Transvaginal, Discharge patient Pregnancy of unknown location Pregnancy at [redacted]w[redacted]d by LMP  PLAN Discharge home Plan to repeat HCG level in 48 hours in clinic per 8:00 am schedule Will repeat  Ultrasound in about 7-10 days if HCG levels double appropriately  Ectopic precautions   Pt stable at time of discharge. Encouraged to return here or to other Urgent Care/ED if she develops worsening of symptoms, increase in pain, fever, or other concerning symptoms.    Wynelle Bourgeois CNM, MSN Certified Nurse-Midwife 01/29/2017  8:40 AM

## 2017-01-30 LAB — GC/CHLAMYDIA PROBE AMP (~~LOC~~) NOT AT ARMC
Chlamydia: NEGATIVE
Neisseria Gonorrhea: NEGATIVE

## 2017-01-30 LAB — HIV ANTIBODY (ROUTINE TESTING W REFLEX): HIV Screen 4th Generation wRfx: NONREACTIVE

## 2017-01-31 ENCOUNTER — Ambulatory Visit: Payer: Managed Care, Other (non HMO) | Admitting: *Deleted

## 2017-01-31 DIAGNOSIS — O3680X0 Pregnancy with inconclusive fetal viability, not applicable or unspecified: Secondary | ICD-10-CM

## 2017-01-31 LAB — HCG, QUANTITATIVE, PREGNANCY: HCG, BETA CHAIN, QUANT, S: 256 m[IU]/mL — AB (ref <5)

## 2017-01-31 NOTE — Progress Notes (Signed)
Pt in for stat hcg for pregnancy of unknown anatomic location. She denies pain or bleeding today. Advised patient of stat hcg process and that I would call her with results in approximately 2 hours. Pt requested that I call her at [phone]. Discussed results with Dr. Macon LargeAnyanwu, she recommends that patient repeat hcg level in 1 week and give ectopic precautions. Discussed results with patient and gave ectopic precautions. Patient voiced understanding and states that she will come in for repeat test next Friday at 8 am.

## 2017-02-03 ENCOUNTER — Ambulatory Visit: Payer: Managed Care, Other (non HMO)

## 2017-02-07 ENCOUNTER — Other Ambulatory Visit: Payer: Managed Care, Other (non HMO)

## 2017-05-10 ENCOUNTER — Encounter (HOSPITAL_COMMUNITY): Payer: Self-pay

## 2017-05-10 ENCOUNTER — Inpatient Hospital Stay (HOSPITAL_COMMUNITY)
Admission: AD | Admit: 2017-05-10 | Discharge: 2017-05-10 | Disposition: A | Discharge status: Home / Self Care | Payer: Managed Care, Other (non HMO) | Source: Ambulatory Visit | Attending: Obstetrics & Gynecology | Admitting: Obstetrics & Gynecology

## 2017-05-10 DIAGNOSIS — O98812 Other maternal infectious and parasitic diseases complicating pregnancy, second trimester: Secondary | ICD-10-CM | POA: Insufficient documentation

## 2017-05-10 DIAGNOSIS — B3731 Acute candidiasis of vulva and vagina: Secondary | ICD-10-CM

## 2017-05-10 DIAGNOSIS — Z3A18 18 weeks gestation of pregnancy: Secondary | ICD-10-CM | POA: Insufficient documentation

## 2017-05-10 DIAGNOSIS — Z87891 Personal history of nicotine dependence: Secondary | ICD-10-CM | POA: Insufficient documentation

## 2017-05-10 DIAGNOSIS — N898 Other specified noninflammatory disorders of vagina: Secondary | ICD-10-CM | POA: Diagnosis present

## 2017-05-10 DIAGNOSIS — B373 Candidiasis of vulva and vagina: Secondary | ICD-10-CM | POA: Diagnosis not present

## 2017-05-10 DIAGNOSIS — O9989 Other specified diseases and conditions complicating pregnancy, childbirth and the puerperium: Secondary | ICD-10-CM | POA: Diagnosis not present

## 2017-05-10 LAB — URINALYSIS, ROUTINE W REFLEX MICROSCOPIC
Bilirubin Urine: NEGATIVE
GLUCOSE, UA: NEGATIVE mg/dL
KETONES UR: NEGATIVE mg/dL
Nitrite: NEGATIVE
PROTEIN: NEGATIVE mg/dL
Specific Gravity, Urine: 1.003 — ABNORMAL LOW (ref 1.005–1.030)
pH: 6 (ref 5.0–8.0)

## 2017-05-10 LAB — WET PREP, GENITAL
Clue Cells Wet Prep HPF POC: NONE SEEN
Sperm: NONE SEEN
Trich, Wet Prep: NONE SEEN

## 2017-05-10 MED ORDER — TERCONAZOLE 0.4 % VA CREA: 1.0000 | TOPICAL_CREAM | Freq: Every day | VAGINAL | 0 refills | Status: DC

## 2017-05-10 NOTE — MAU Note (Signed)
Pt reports discharge is green- for a few days, more noticeable today. No odor. Mucus like.

## 2017-05-10 NOTE — Discharge Instructions (Signed)
Get your medication from your pharmacy and use at bedtime as directed. Be seen in the office if you feel more braxton [name] this week or if you have any vaginal bleeding.

## 2017-05-10 NOTE — MAU Provider Note (Signed)
History     CSN: [id number]  Arrival date and time: 05/10/17 1613   First Provider Initiated Contact with Patient 05/10/17 1641      Chief Complaint  Patient presents with  . Vaginal Discharge   HPI [name] 25 y.o. [redacted]w[redacted]d by [place] EDC of 10-08-17.  Has had vaginal discharge today and has noticed some uterine tightening - more braxton hicks than usual.  Came to be checked.  Said her providers have asked previously if she was having any itching and she has not had itching until the past couple of days.  OB History    Gravida Para Term Preterm AB Living   SAB TAB Ectopic Multiple Live Births                  Past Medical History:  Diagnosis Date  . Heartburn    TUMS PRN  . Migraines     Past Surgical History:  Procedure Laterality Date  . APPENDECTOMY    . CESAREAN SECTION     X 2 WH  . HYSTEROSCOPY N/A 05/09/2014   Procedure: HYSTEROSCOPY WITH REMOVAL OF IUD; POSSIBLE LAPAROSCOPY;  Surgeon: Mitchel Honour, DO;  Location: [facility];  Service: Gynecology;  Laterality: N/A;    History reviewed. No pertinent family history.  Social History  Substance Use Topics  . Smoking status: Former Smoker    Packs/day: 0.25    Years: 4.00    Types: Cigarettes    Quit date: 08/05/2013  . Smokeless tobacco: Never Used  . Alcohol use Yes     Comment: DAILY - MIXED DRINK 1 PER NIGHT    Allergies: No Known Allergies  Prescriptions Prior to Admission  Medication Sig Dispense Refill Last Dose  . acetaminophen (TYLENOL) 500 MG tablet Take 500 mg by mouth every 6 (six) hours as needed for headache.   05/09/2017 at Unknown time  . calcium carbonate (TUMS - DOSED IN MG ELEMENTAL CALCIUM) 500 MG chewable tablet Chew 1,000 mg by mouth daily as needed for indigestion or heartburn.   Past Week at Unknown time  . Prenatal Vit-Fe Fumarate-FA (PRENATAL MULTIVITAMIN) TABS tablet Take 1 tablet by mouth daily at 12 noon.   05/09/2017 at Unknown time    Review of Systems   Constitutional: Negative for fever.  Gastrointestinal: Negative for nausea and vomiting.       Some abdominal tightening - Braxton Hicks  Genitourinary: Positive for vaginal discharge. Negative for dysuria and vaginal bleeding.       Vaginal itching   Physical Exam   Blood pressure (!) 103/51, pulse 81, temperature 98.1 F (36.7 C), temperature source Oral, resp. rate 16, last menstrual period 12/19/2016.  Physical Exam  Nursing note and vitals reviewed. Constitutional: She is oriented to person, place, and time. She appears well-developed and well-nourished.  HENT:  Head: Normocephalic.  Eyes: EOM are normal.  Neck: Neck supple.  GI: Soft. There is no tenderness. There is no rebound and no guarding.  No abdominal tightening palpated by examiner  Genitourinary:  Genitourinary Comments: Speculum exam: vulva - very pink and edematous labia minora Vagina - Modl amount of watery discharge and lots of spots of adherent yellow discharge on vaginal walls Cervix - No contact bleeding Bimanual exam: Cervix closed and thick GC/Chlam, wet prep done Chaperone present for exam.   Musculoskeletal: Normal range of motion.  Neurological: She is alert and oriented to person, place, and time.  Skin:  Skin is warm and dry.  Psychiatric: She has a normal mood and affect.    MAU Course  Procedures Results for orders placed or performed during the hospital encounter of 05/10/17 (from the past 24 hour(s))  Urinalysis, Routine w reflex microscopic     Status: Abnormal   Collection Time: 05/10/17  4:25 PM  Result Value Ref Range   Color, Urine STRAW (A) YELLOW   APPearance CLEAR CLEAR   Specific Gravity, Urine 1.003 (L) 1.005 - 1.030   pH 6.0 5.0 - 8.0   Glucose, UA NEGATIVE NEGATIVE mg/dL   Hgb urine dipstick SMALL (A) NEGATIVE   Bilirubin Urine NEGATIVE NEGATIVE   Ketones, ur NEGATIVE NEGATIVE mg/dL   Protein, ur NEGATIVE NEGATIVE mg/dL   Nitrite NEGATIVE NEGATIVE   Leukocytes, UA LARGE  (A) NEGATIVE   RBC / HPF 6-30 0 - 5 RBC/hpf   WBC, UA 0-5 0 - 5 WBC/hpf   Bacteria, UA RARE (A) NONE SEEN   Squamous Epithelial / LPF 0-5 (A) NONE SEEN  Wet prep, genital     Status: Abnormal   Collection Time: 05/10/17  4:50 PM  Result Value Ref Range   Yeast Wet Prep HPF POC PRESENT (A) NONE SEEN   Trich, Wet Prep NONE SEEN NONE SEEN   Clue Cells Wet Prep HPF POC NONE SEEN NONE SEEN   WBC, Wet Prep HPF POC MANY (A) NONE SEEN   Sperm NONE SEEN     MDM No UTI identified today - urine culture done due to increased RBC in urine.  Advised her to follow up in the office this week if abdominal cramping is any worse.  Today her cervix was long and closed.  Assessment and Plan  Vaginal yeast infection at 18 weeks  Plan Drink at least 8 8-oz glasses of water every day. Get your medication from your pharmacy and use vaginally at bedtime.   Terri L Burleson 05/10/2017, 4:53 PM

## 2017-05-11 LAB — CULTURE, OB URINE

## 2017-05-12 LAB — GC/CHLAMYDIA PROBE AMP (~~LOC~~) NOT AT ARMC
Chlamydia: NEGATIVE
Neisseria Gonorrhea: NEGATIVE

## 2017-06-06 ENCOUNTER — Encounter (HOSPITAL_COMMUNITY): Payer: Self-pay | Admitting: *Deleted

## 2017-06-06 ENCOUNTER — Inpatient Hospital Stay (HOSPITAL_COMMUNITY)
Admission: AD | Admit: 2017-06-06 | Discharge: 2017-06-06 | Disposition: A | Discharge status: Home / Self Care | Payer: Managed Care, Other (non HMO) | Source: Ambulatory Visit | Attending: Obstetrics and Gynecology | Admitting: Obstetrics and Gynecology

## 2017-06-06 DIAGNOSIS — O98812 Other maternal infectious and parasitic diseases complicating pregnancy, second trimester: Secondary | ICD-10-CM | POA: Insufficient documentation

## 2017-06-06 DIAGNOSIS — Z79899 Other long term (current) drug therapy: Secondary | ICD-10-CM | POA: Insufficient documentation

## 2017-06-06 DIAGNOSIS — R1010 Upper abdominal pain, unspecified: Secondary | ICD-10-CM | POA: Diagnosis present

## 2017-06-06 DIAGNOSIS — B373 Candidiasis of vulva and vagina: Secondary | ICD-10-CM | POA: Insufficient documentation

## 2017-06-06 DIAGNOSIS — Z3A22 22 weeks gestation of pregnancy: Secondary | ICD-10-CM | POA: Diagnosis not present

## 2017-06-06 DIAGNOSIS — O26892 Other specified pregnancy related conditions, second trimester: Secondary | ICD-10-CM | POA: Diagnosis present

## 2017-06-06 DIAGNOSIS — B3731 Acute candidiasis of vulva and vagina: Secondary | ICD-10-CM | POA: Diagnosis present

## 2017-06-06 DIAGNOSIS — Z87891 Personal history of nicotine dependence: Secondary | ICD-10-CM | POA: Insufficient documentation

## 2017-06-06 LAB — URINALYSIS, ROUTINE W REFLEX MICROSCOPIC
BILIRUBIN URINE: NEGATIVE
GLUCOSE, UA: NEGATIVE mg/dL
HGB URINE DIPSTICK: NEGATIVE
Ketones, ur: NEGATIVE mg/dL
NITRITE: NEGATIVE
PROTEIN: NEGATIVE mg/dL
RBC / HPF: NONE SEEN RBC/hpf (ref 0–5)
Specific Gravity, Urine: 1.002 — ABNORMAL LOW (ref 1.005–1.030)
pH: 7 (ref 5.0–8.0)

## 2017-06-06 LAB — WET PREP, GENITAL
Clue Cells Wet Prep HPF POC: NONE SEEN
Sperm: NONE SEEN
Trich, Wet Prep: NONE SEEN

## 2017-06-06 MED ORDER — TERCONAZOLE 0.4 % VA CREA: 1.0000 | TOPICAL_CREAM | Freq: Every day | VAGINAL | 0 refills | Status: AC

## 2017-06-06 NOTE — MAU Note (Signed)
Pt states she started having upper abd pain this morning, has continued throughout the day.  Denies bleeding or LOF.  Was advised by MD office to come to MAU.  No lower abd pain.

## 2017-06-06 NOTE — MAU Provider Note (Signed)
History     CSN: [id number]  Arrival date and time: 06/06/17 1431   First Provider Initiated Contact with Patient 06/06/17 1818      Chief Complaint  Patient presents with  . Abdominal Pain   HPI  Ms. [name] is a 26 y.o. G3P2002 at [redacted]w[redacted]d gestation presenting to MAU with complaints of upper abdominal pain that started this morning and continued throughout the day while she was at work. She works at [employer] on her feet all day and only drank 2 bottles of water. She reports that the UC'Yvette have gone away since she arrived at MAU, but she was told to come here by her OB'Yvette office. She denies VB or LOF.  Past Medical History:  Diagnosis Date  . Heartburn    TUMS PRN  . Migraines     Past Surgical History:  Procedure Laterality Date  . APPENDECTOMY    . CESAREAN SECTION     X 2 WH  . HYSTEROSCOPY N/A 05/09/2014   Procedure: HYSTEROSCOPY WITH REMOVAL OF IUD; POSSIBLE LAPAROSCOPY;  Surgeon: Mitchel Honour, DO;  Location: [facility];  Service: Gynecology;  Laterality: N/A;    History reviewed. No pertinent family history.  Social History  Substance Use Topics  . Smoking status: Former Smoker    Packs/day: 0.25    Years: 4.00    Types: Cigarettes    Quit date: 08/05/2013  . Smokeless tobacco: Never Used  . Alcohol use No     Comment: DAILY - MIXED DRINK 1 PER NIGHT    Allergies: No Known Allergies  Prescriptions Prior to Admission  Medication Sig Dispense Refill Last Dose  . acetaminophen (TYLENOL) 500 MG tablet Take 500 mg by mouth every 6 (six) hours as needed for headache.   06/05/2017 at Unknown time  . calcium carbonate (TUMS - DOSED IN MG ELEMENTAL CALCIUM) 500 MG chewable tablet Chew 1,000 mg by mouth daily as needed for indigestion or heartburn.   Past Month at Unknown time  . Prenatal Vit-Fe Fumarate-FA (PRENATAL MULTIVITAMIN) TABS tablet Take 1 tablet by mouth daily at 12 noon.   Past Week at Unknown time  . terconazole (TERAZOL 7) 0.4 % vaginal cream Place 1  applicator vaginally at bedtime. Use for 7 days. (Patient not taking: Reported on 06/06/2017) 45 g 0 Not Taking at Unknown time    Review of Systems  Constitutional: Negative.   HENT: Negative.   Eyes: Negative.   Respiratory: Negative.   Cardiovascular: Negative.   Gastrointestinal: Negative.   Endocrine: Negative.   Genitourinary: Positive for vaginal discharge (increased amount).  Musculoskeletal: Negative.   Skin: Negative.   Allergic/Immunologic: Negative.   Neurological: Negative.   Hematological: Negative.   Psychiatric/Behavioral: Negative.    Physical Exam   Blood pressure (!) 107/57, pulse 92, temperature 98.6 F (37 C), temperature source Oral, resp. rate 18, last menstrual period 12/19/2016, SpO2 99 %.  Physical Exam  Nursing note and vitals reviewed. Constitutional: She is oriented to person, place, and time. She appears well-developed and well-nourished.  HENT:  Head: Normocephalic.  Eyes: Pupils are equal, round, and reactive to light.  Neck: Normal range of motion.  Cardiovascular: Normal rate, regular rhythm and normal heart sounds.   Respiratory: Effort normal and breath sounds normal.  GI: Soft. Bowel sounds are normal.  Genitourinary:  Genitourinary Comments: Uterus: gravid, Yvette=D, cx: smooth, pink, no lesions, moderate amt of thick, white vaginal d/c, closed/long/firm, no CMT or friability, no adnexal tenderness   Musculoskeletal:  Normal range of motion.  Neurological: She is alert and oriented to person, place, and time. She has normal reflexes.  Skin: Skin is warm and dry.  Psychiatric: She has a normal mood and affect. Her behavior is normal. Judgment and thought content normal.    MAU Course  Procedures  MDM CCUA Wet Prep GC/CT FHTs by doppler: 146 bpm  NST - TOCO: none   Results for orders placed or performed during the hospital encounter of 06/06/17 (from the past 24 hour(Yvette))  Urinalysis, Routine w reflex microscopic     Status: Abnormal    Collection Time: 06/06/17  3:03 PM  Result Value Ref Range   Color, Urine STRAW (A) YELLOW   APPearance CLEAR CLEAR   Specific Gravity, Urine 1.002 (L) 1.005 - 1.030   pH 7.0 5.0 - 8.0   Glucose, UA NEGATIVE NEGATIVE mg/dL   Hgb urine dipstick NEGATIVE NEGATIVE   Bilirubin Urine NEGATIVE NEGATIVE   Ketones, ur NEGATIVE NEGATIVE mg/dL   Protein, ur NEGATIVE NEGATIVE mg/dL   Nitrite NEGATIVE NEGATIVE   Leukocytes, UA TRACE (A) NEGATIVE   RBC / HPF NONE SEEN 0 - 5 RBC/hpf   WBC, UA 0-5 0 - 5 WBC/hpf   Bacteria, UA RARE (A) NONE SEEN   Squamous Epithelial / LPF 0-5 (A) NONE SEEN  Wet prep, genital     Status: Abnormal   Collection Time: 06/06/17  6:27 PM  Result Value Ref Range   Yeast Wet Prep HPF POC PRESENT (A) NONE SEEN   Trich, Wet Prep NONE SEEN NONE SEEN   Clue Cells Wet Prep HPF POC NONE SEEN NONE SEEN   WBC, Wet Prep HPF POC MODERATE (A) NONE SEEN   Sperm NONE SEEN     Assessment and Plan  Candida vaginitis - Rx for Terazol 0.4% vaginal cream hs x 5 nights - Information provided on yeast infection and terazol cream  Discharge home Keep scheduled appt with [facility] Patient verbalized an understanding of the plan of care and agrees.    Yvette Moraolitta Zalen Sequeira, MSN, CNM 06/06/2017, 6:33 PM

## 2017-06-09 LAB — GC/CHLAMYDIA PROBE AMP (~~LOC~~) NOT AT ARMC
CHLAMYDIA, DNA PROBE: NEGATIVE
Neisseria Gonorrhea: NEGATIVE

## 2017-08-14 LAB — OB RESULTS CONSOLE GBS: GBS: POSITIVE

## 2017-08-30 ENCOUNTER — Other Ambulatory Visit: Payer: Self-pay

## 2017-08-30 ENCOUNTER — Encounter (HOSPITAL_COMMUNITY): Payer: Self-pay | Admitting: *Deleted

## 2017-08-30 ENCOUNTER — Inpatient Hospital Stay (HOSPITAL_COMMUNITY)
Admission: AD | Admit: 2017-08-30 | Discharge: 2017-08-30 | Disposition: A | Discharge status: Home / Self Care | Payer: Managed Care, Other (non HMO) | Source: Ambulatory Visit | Attending: Obstetrics & Gynecology | Admitting: Obstetrics & Gynecology

## 2017-08-30 DIAGNOSIS — B373 Candidiasis of vulva and vagina: Secondary | ICD-10-CM

## 2017-08-30 DIAGNOSIS — B3731 Acute candidiasis of vulva and vagina: Secondary | ICD-10-CM

## 2017-08-30 DIAGNOSIS — Z87891 Personal history of nicotine dependence: Secondary | ICD-10-CM | POA: Insufficient documentation

## 2017-08-30 DIAGNOSIS — O26893 Other specified pregnancy related conditions, third trimester: Secondary | ICD-10-CM | POA: Diagnosis present

## 2017-08-30 DIAGNOSIS — Z3A34 34 weeks gestation of pregnancy: Secondary | ICD-10-CM | POA: Diagnosis not present

## 2017-08-30 DIAGNOSIS — Z0371 Encounter for suspected problem with amniotic cavity and membrane ruled out: Secondary | ICD-10-CM

## 2017-08-30 DIAGNOSIS — O98813 Other maternal infectious and parasitic diseases complicating pregnancy, third trimester: Secondary | ICD-10-CM

## 2017-08-30 LAB — POCT FERN TEST: POCT Fern Test: NEGATIVE

## 2017-08-30 LAB — AMNISURE RUPTURE OF MEMBRANE (ROM) NOT AT ARMC: Amnisure ROM: NEGATIVE

## 2017-08-30 LAB — WET PREP, GENITAL
Clue Cells Wet Prep HPF POC: NONE SEEN
Sperm: NONE SEEN
Trich, Wet Prep: NONE SEEN

## 2017-08-30 MED ORDER — TERCONAZOLE 0.8 % VA CREA: 1.0000 | TOPICAL_CREAM | Freq: Every day | VAGINAL | 0 refills | Status: DC

## 2017-08-30 NOTE — MAU Provider Note (Signed)
History     CSN: [id number]  Arrival date and time: 08/30/17 2148   First Provider Initiated Contact with Patient 08/30/17 2226      Chief Complaint  Patient presents with  . Rupture of Membranes   HPI  [name] is a [age] y.o. G3P2002 at 8217w4d who presents for LOF. Symptoms began yesterday. Reports 2 episodes in the last 24 hours of a popping feeling followed by wet underwear. Leaking has not continued. Denies recent intercourse, abdominal pain, or vaginal bleeding. Positive fetal movement.  Scheduled for repeat c/section on 10/01/17.  OB History    Gravida Para Term Preterm AB Living   3 2 2     2    SAB TAB Ectopic Multiple Live Births           2      Past Medical History:  Diagnosis Date  . Heartburn    TUMS PRN  . Migraines     Past Surgical History:  Procedure Laterality Date  . APPENDECTOMY    . CESAREAN SECTION     X 2 WH  . HYSTEROSCOPY N/A 05/09/2014   Procedure: HYSTEROSCOPY WITH REMOVAL OF IUD; POSSIBLE LAPAROSCOPY;  Surgeon: Mitchel HonourMegan Morris, DO;  Location: [facility];  Service: Gynecology;  Laterality: N/A;    History reviewed. No pertinent family history.  Social History   Tobacco Use  . Smoking status: Former Smoker    Packs/day: 0.25    Years: 4.00    Pack years: 1.00    Types: Cigarettes    Last attempt to quit: 08/05/2013    Years since quitting: 4.0  . Smokeless tobacco: Never Used  Substance Use Topics  . Alcohol use: No    Comment: DAILY - MIXED DRINK 1 PER NIGHT  . Drug use: No    Comment: LAST USE 04/16/14    Allergies: No Known Allergies  Medications Prior to Admission  Medication Sig Dispense Refill Last Dose  . acetaminophen (TYLENOL) 500 MG tablet Take 500 mg by mouth every 6 (six) hours as needed for headache.   06/05/2017 at Unknown time  . calcium carbonate (TUMS - DOSED IN MG ELEMENTAL CALCIUM) 500 MG chewable tablet Chew 1,000 mg by mouth daily as needed for indigestion or heartburn.   Past Month at Unknown time  . Prenatal  Vit-Fe Fumarate-FA (PRENATAL MULTIVITAMIN) TABS tablet Take 1 tablet by mouth daily at 12 noon.   Past Week at Unknown time    Review of Systems  Constitutional: Negative.   Gastrointestinal: Negative.   Genitourinary: Positive for vaginal discharge. Negative for dysuria and vaginal bleeding.   Physical Exam   Blood pressure 132/63, pulse 84, temperature 98.7 F (37.1 C), temperature source Oral, resp. rate 16, height 5\' 3"  (1.6 m), weight 169 lb (76.7 kg), last menstrual period 12/19/2016.  Physical Exam  Nursing note and vitals reviewed. Constitutional: She is oriented to person, place, and time. She appears well-developed and well-nourished. No distress.  HENT:  Head: Normocephalic and atraumatic.  Eyes: Conjunctivae are normal. Right eye exhibits no discharge. Left eye exhibits no discharge. No scleral icterus.  Neck: Normal range of motion.  Respiratory: Effort normal. No respiratory distress.  Genitourinary: No bleeding in the vagina. Vaginal discharge (moderate amount of clumpy white discharge. No pooling of fluid. Cervix visually closed) found.  Neurological: She is alert and oriented to person, place, and time.  Skin: Skin is warm and dry. She is not diaphoretic.  Psychiatric: She has a normal mood and affect.  Her behavior is normal. Judgment and thought content normal.    MAU Course  Procedures Results for orders placed or performed during the hospital encounter of 08/30/17 (from the past 24 hour(s))  Wet prep, genital     Status: Abnormal   Collection Time: 08/30/17 10:30 PM  Result Value Ref Range   Yeast Wet Prep HPF POC PRESENT (A) NONE SEEN   Trich, Wet Prep NONE SEEN NONE SEEN   Clue Cells Wet Prep HPF POC NONE SEEN NONE SEEN   WBC, Wet Prep HPF POC MANY (A) NONE SEEN   Sperm NONE SEEN   Fern Test     Status: None   Collection Time: 08/30/17 10:37 PM  Result Value Ref Range   POCT Fern Test Negative = intact amniotic membranes   Amnisure rupture of membrane  (rom)not at [facility]     Status: None   Collection Time: 08/30/17 10:40 PM  Result Value Ref Range   Amnisure ROM NEGATIVE     MDM NST:  Baseline: 125 bpm, Variability: Good {> 6 bpm), Accelerations: Reactive and Decelerations: Absent No pooling & fern negative Amnisure negative C/w Dr. Langston Masker. Ok to discharge home Assessment and Plan  A: 1. Vaginal yeast infection   2. Encounter for suspected PROM, with rupture of membranes not found   3. [redacted] weeks gestation of pregnancy    P: Discharge home Rx terazol Discussed reasons to return to MAU Keep f/u with OB  Judeth Horn 08/30/2017, 10:26 PM

## 2017-08-30 NOTE — Discharge Instructions (Signed)

## 2017-08-30 NOTE — MAU Note (Signed)
Pt reports feeling a pop and leaking around 1:30 pm yesterday. Pt also had some leaking this evening. + FM no recent intercourse

## 2017-08-30 NOTE — MAU Note (Signed)
Urine sent to lab 

## 2017-09-16 ENCOUNTER — Telehealth (HOSPITAL_COMMUNITY): Payer: Self-pay | Admitting: *Deleted

## 2017-09-16 ENCOUNTER — Encounter (HOSPITAL_COMMUNITY): Payer: Self-pay | Admitting: *Deleted

## 2017-09-16 NOTE — Telephone Encounter (Signed)
Preadmission screen  

## 2017-09-17 ENCOUNTER — Telehealth (HOSPITAL_COMMUNITY): Payer: Self-pay | Admitting: *Deleted

## 2017-09-17 NOTE — Telephone Encounter (Signed)
Preadmission screen  

## 2017-09-18 ENCOUNTER — Telehealth (HOSPITAL_COMMUNITY): Payer: Self-pay | Admitting: *Deleted

## 2017-09-18 ENCOUNTER — Encounter (HOSPITAL_COMMUNITY): Payer: Self-pay

## 2017-09-18 NOTE — Telephone Encounter (Signed)
Preadmission screen  

## 2017-09-28 ENCOUNTER — Other Ambulatory Visit: Payer: Self-pay

## 2017-09-28 ENCOUNTER — Inpatient Hospital Stay (EMERGENCY_DEPARTMENT_HOSPITAL)
Admission: AD | Admit: 2017-09-28 | Discharge: 2017-09-28 | Disposition: A | Discharge status: Home / Self Care | Payer: Managed Care, Other (non HMO) | Source: Ambulatory Visit | Attending: Obstetrics and Gynecology | Admitting: Obstetrics and Gynecology

## 2017-09-28 ENCOUNTER — Encounter (HOSPITAL_COMMUNITY): Payer: Self-pay | Admitting: *Deleted

## 2017-09-28 DIAGNOSIS — J069 Acute upper respiratory infection, unspecified: Secondary | ICD-10-CM

## 2017-09-28 DIAGNOSIS — O9989 Other specified diseases and conditions complicating pregnancy, childbirth and the puerperium: Secondary | ICD-10-CM | POA: Diagnosis not present

## 2017-09-28 DIAGNOSIS — Z3A38 38 weeks gestation of pregnancy: Secondary | ICD-10-CM | POA: Diagnosis not present

## 2017-09-28 DIAGNOSIS — J101 Influenza due to other identified influenza virus with other respiratory manifestations: Secondary | ICD-10-CM

## 2017-09-28 DIAGNOSIS — O34211 Maternal care for low transverse scar from previous cesarean delivery: Secondary | ICD-10-CM | POA: Diagnosis not present

## 2017-09-28 LAB — INFLUENZA PANEL BY PCR (TYPE A & B)
INFLBPCR: NEGATIVE
Influenza A By PCR: POSITIVE — AB

## 2017-09-28 MED ORDER — LORATADINE 10 MG PO TABS
10.0000 mg | ORAL_TABLET | Freq: Every day | ORAL | Status: DC
  Filled 2017-09-28: qty 1

## 2017-09-28 MED ORDER — OSELTAMIVIR PHOSPHATE 75 MG PO CAPS: 75.0000 mg | ORAL_CAPSULE | Freq: Two times a day (BID) | ORAL | 0 refills | Status: AC

## 2017-09-28 NOTE — MAU Provider Note (Signed)
Chief Complaint:  Nasal Congestion   First Provider Initiated Contact with Patient 09/28/17 [phone]      HPI: [name] is a 27 y.o. G3P2002 at 2664w4d who presents to maternity admissions reporting concerns about the flu with cough, congestion, runny nose, sore throat x 3 days.  She denies fever, chills, or body aches.  She has scheduled repeat C/S on 2/27 and she came in because her husband wanted to get checked out and make sure it is not the flu. Her husband had mild sore throat 1 week ago, no other symptoms. Her son has similar symptoms with runny nose and cough.  She took Tylenol for h/a 2 days ago, no other medications since then for symptoms. She reports her lower abdomen is sore and hurts when she coughs but she denies any cramping or contractions.  There are no other associated symptoms. She did receive her flu vaccine during this pregnancy. She reports good fetal movement, denies LOF, vaginal bleeding, vaginal itching/burning, urinary symptoms, h/a, dizziness, n/v, or fever/chills.    HPI  Past Medical History: Past Medical History:  Diagnosis Date  . Anxiety   . Heartburn    TUMS PRN  . Hx of acute pyelonephritis   . Migraines     Past obstetric history: OB History  Gravida Para Term Preterm AB Living  3 2 2     2   SAB TAB Ectopic Multiple Live Births          2    # Outcome Date GA Lbr Len/2nd Weight Sex Delivery Anes PTL Lv  3 Current           2 Term 09/17/08    F CS-LTranv   LIV  1 Term 01/15/07    Wandalee FerdinandM CS-LTranv   LIV      Past Surgical History: Past Surgical History:  Procedure Laterality Date  . APPENDECTOMY    . CESAREAN SECTION     X 2 WH  . HYSTEROSCOPY N/A 05/09/2014   Procedure: HYSTEROSCOPY WITH REMOVAL OF IUD; POSSIBLE LAPAROSCOPY;  Surgeon: Mitchel HonourMegan Morris, DO;  Location: [facility];  Service: Gynecology;  Laterality: N/A;    Family History: Family History  Problem Relation Age of Onset  . Heart attack Maternal Grandfather     Social History: Social  History   Tobacco Use  . Smoking status: Former Smoker    Packs/day: 0.25    Years: 4.00    Pack years: 1.00    Types: Cigarettes    Last attempt to quit: 08/05/2013    Years since quitting: 4.1  . Smokeless tobacco: Never Used  Substance Use Topics  . Alcohol use: No    Comment: DAILY - MIXED DRINK 1 PER NIGHT  . Drug use: No    Comment: LAST USE 04/16/14    Allergies: No Known Allergies  Meds:  No medications prior to admission.    ROS:  Review of Systems  Constitutional: Negative for chills, fatigue and fever.  HENT: Positive for congestion, postnasal drip, rhinorrhea and sore throat.   Respiratory: Positive for cough. Negative for shortness of breath.   Cardiovascular: Negative for chest pain.  Gastrointestinal: Positive for abdominal pain.  Genitourinary: Negative for difficulty urinating, dysuria, flank pain, pelvic pain, vaginal bleeding, vaginal discharge and vaginal pain.  Neurological: Negative for dizziness and headaches.  Psychiatric/Behavioral: Negative.      I have reviewed patient's Past Medical Hx, Surgical Hx, Family Hx, Social Hx, medications and allergies.   Physical Exam  Patient Vitals for the past 24 hrs:  BP Temp Pulse Resp Height Weight  09/28/17 0641 110/66 97.7 F (36.5 C) 90 18 5\' 2"  (1.575 m) 171 lb (77.6 kg)   Constitutional: Well-developed, well-nourished female in no acute distress.  HEART: normal rate, heart sounds, regular rhythm RESP: normal effort, lung sounds clear and equal bilaterally GI: Abd soft, non-tender, gravid appropriate for gestational age.  MS: Extremities nontender, no edema, normal ROM Neurologic: Alert and oriented x 4.  GU: Neg CVAT.      FHT:  Baseline 125 , moderate variability, accelerations present, no decelerations Contractions: rare, mild to palpation   Labs: Results for orders placed or performed during the hospital encounter of 09/28/17 (from the past 24 hour(s))  Influenza panel by PCR (type A &  B)     Status: Abnormal   Collection Time: 09/28/17  7:30 AM  Result Value Ref Range   Influenza A By PCR POSITIVE (A) NEGATIVE   Influenza B By PCR NEGATIVE NEGATIVE   --/--/A POS (06/27 1610)  Imaging:  No results found.  MAU Course/MDM: Although pt afebrile, will collect influenza swab due to pending delivery and newborn NST reviewed and reactive Influenza A positive today Consult Grewal with presentation, exam findings and test results.  Start Tamiflu 75 mg BID x 5 days DC home, list of safe OTC medications in pregnancy given Keep scheduled appt this week for pre-op and cesarean section unless symptoms worsen Return to MAU as needed for emergencies Pt discharge with strict infection and labor precautions.   Assessment: 1. Upper respiratory infection, viral   2. Pregnancy with 38 completed weeks gestation   3. Influenza A     Plan: Discharge home Labor precautions and fetal kick counts Follow-up Information    Marcelle Overlie, MD Follow up.   Specialty:  Obstetrics and Gynecology Contact information: [address] [place] [address] [phone]        [facility] [place] Follow up.   Why:  As scheduled on 2/27 for your cesarean section, return to MAU sooner as needed for emergencies Contact information: [address] [place] [address] [phone]         Allergies as of 09/28/2017   No Known Allergies     Medication List    TAKE these medications   acetaminophen 500 MG tablet Commonly known as:  TYLENOL Take 500 mg by mouth every 6 (six) hours as needed for headache.   calcium carbonate 500 MG chewable tablet Commonly known as:  TUMS - dosed in mg elemental calcium Chew 1,000 mg by mouth daily as needed for indigestion or heartburn.   ferrous sulfate 325 (65 FE) MG EC tablet Take 325 mg by mouth 3 (three) times daily with meals.   oseltamivir 75 MG capsule Commonly known as:   TAMIFLU Take 1 capsule (75 mg total) by mouth 2 (two) times daily for 5 days.   prenatal multivitamin Tabs tablet Take 1 tablet by mouth daily at 12 noon.   terconazole 0.8 % vaginal cream Commonly known as:  TERAZOL 3 Place 1 applicator vaginally at bedtime.       Sharen Counter Certified Nurse-Midwife 09/28/2017 8:48 AM

## 2017-09-28 NOTE — MAU Note (Signed)
In mornings symptoms are worse -- congestion, sore throat, runny nose, cough for 3 days. After I have been up awhile symptoms subside. Do not feel bad. My husband insisted I come in and be checked. Denies lOF or bleeding. My stomach is sore from coughing. No fever or chills

## 2017-09-28 NOTE — H&P (Signed)
Yvette BlizzardJessica L Azam is a 27 y.o. female presenting for repeat C/S and BTL; previous C/S x 2.  Antepartum course complicated by anxiety on no medication.  GBS positive.  OB History    Gravida Para Term Preterm AB Living   3 2 2     2    SAB TAB Ectopic Multiple Live Births           2     Past Medical History:  Diagnosis Date  . Anxiety   . Heartburn    TUMS PRN  . Hx of acute pyelonephritis   . Migraines    Past Surgical History:  Procedure Laterality Date  . APPENDECTOMY    . CESAREAN SECTION     X 2 WH  . HYSTEROSCOPY N/A 05/09/2014   Procedure: HYSTEROSCOPY WITH REMOVAL OF IUD; POSSIBLE LAPAROSCOPY;  Surgeon: Mitchel HonourMegan Lavalle Skoda, DO;  Location: [facility];  Service: Gynecology;  Laterality: N/A;   Family History: family history includes Heart attack in her maternal grandfather. Social History:  reports that she quit smoking about 4 years ago. Her smoking use included cigarettes. She has a 1.00 pack-year smoking history. she has never used smokeless tobacco. She reports that she does not drink alcohol or use drugs.     Maternal Diabetes: No Genetic Screening: Normal Maternal Ultrasounds/Referrals: Normal Fetal Ultrasounds or other Referrals:  None Maternal Substance Abuse:  No Significant Maternal Medications:  None Significant Maternal Lab Results:  Lab values include: Group B Strep positive Other Comments:  None  ROS Maternal Medical History:  Prenatal complications: no prenatal complications Prenatal Complications - Diabetes: none.      Last menstrual period 12/19/2016. Maternal Exam:  Abdomen: Surgical scars: low transverse.   Fundal height is c/w dates.   Estimated fetal weight is 7#8.       Physical Exam  Constitutional: She is oriented to person, place, and time. She appears well-developed and well-nourished.  GI: Soft. There is no rebound.  Neurological: She is alert and oriented to person, place, and time.  Skin: Skin is warm and dry.  Psychiatric: She has a  normal mood and affect. Her behavior is normal.    Prenatal labs: ABO, Rh: --/--/A POS (06/27 [id number]) Antibody:   Rubella:   RPR:    HBsAg:    HIV: Non Reactive (06/27 0904)  GBS: Positive (01/10 0000)   Assessment/Plan: 27yo G3P2002 at 39 weeks for repeat C/S and desires sterility Patient has been counseled re: risk of bleeding, infection, scarring, and damage to surrounding structures.  She understands the risk of permanence and regret regarding BTL.  All questions were answered and the patient wishes to proceed.  Daryus Sowash 09/28/2017, 7:17 PM

## 2017-09-28 NOTE — Progress Notes (Signed)
Sharen CounterLisa Leftwich-Kirby CNM aware of pt's admission and status. RN to ck cervix and obtain flu swab and call provider with results

## 2017-09-30 ENCOUNTER — Encounter (HOSPITAL_COMMUNITY)
Admission: RE | Admit: 2017-09-30 | Discharge: 2017-09-30 | Disposition: A | Discharge status: Home / Self Care | Payer: Managed Care, Other (non HMO) | Source: Ambulatory Visit | Attending: Obstetrics & Gynecology | Admitting: Obstetrics & Gynecology

## 2017-09-30 LAB — TYPE AND SCREEN
ABO/RH(D): A POS
Antibody Screen: NEGATIVE

## 2017-09-30 LAB — CBC
HCT: 35 % — ABNORMAL LOW (ref 36.0–46.0)
HEMOGLOBIN: 11.5 g/dL — AB (ref 12.0–15.0)
MCH: 28.2 pg (ref 26.0–34.0)
MCHC: 32.9 g/dL (ref 30.0–36.0)
MCV: 85.8 fL (ref 78.0–100.0)
Platelets: 269 10*3/uL (ref 150–400)
RBC: 4.08 MIL/uL (ref 3.87–5.11)
RDW: 14.5 % (ref 11.5–15.5)
WBC: 7.4 10*3/uL (ref 4.0–10.5)

## 2017-09-30 NOTE — Patient Instructions (Signed)
[name]  09/30/2017   Your procedure is scheduled on:  10/01/2017  Enter through the [facility] Entrance of [facility] at 1100 AM.  Pick up the phone at the desk and dial [phone]  Call this number if you have problems the morning of surgery:[phone]  Remember:   Do not eat food:(After Midnight) Desps de medianoche.  Do not drink clear liquids: (After Midnight) Desps de medianoche.  Take these medicines the morning of surgery with A SIP OF WATER: none   Do not wear jewelry, make-up or nail polish.  Do not wear lotions, powders, or perfumes. Do not wear deodorant.  Do not shave 48 hours prior to surgery.  Do not bring valuables to the hospital.  [facility] is not   responsible for any belongings or valuables brought to the hospital.  Contacts, dentures or bridgework may not be worn into surgery.  Leave suitcase in the car. After surgery it may be brought to your room.  For patients admitted to the hospital, checkout time is 11:00 AM the day of              discharge.    N/A   Please read over the following fact sheets that you were given:   Surgical Site Infection Prevention

## 2017-09-30 NOTE — Anesthesia Preprocedure Evaluation (Addendum)
Anesthesia Evaluation  Patient identified by MRN, date of birth, ID band Patient awake    Airway Mallampati: II  TM Distance: >3 FB Neck ROM: Full    Dental no notable dental hx.    Pulmonary former smoker,    Pulmonary exam normal breath sounds clear to auscultation       Cardiovascular negative cardio ROS Normal cardiovascular exam Rhythm:Regular Rate:Normal     Neuro/Psych  Headaches, Anxiety    GI/Hepatic   Endo/Other    Renal/GU      Musculoskeletal   Abdominal   Peds  Hematology   Anesthesia Other Findings   Reproductive/Obstetrics (+) Pregnancy                            Lab Results  Component Value Date   WBC 7.4 09/30/2017   HGB 11.5 (L) 09/30/2017   HCT 35.0 (L) 09/30/2017   MCV 85.8 09/30/2017   PLT 269 09/30/2017   Lab Results  Component Value Date   CREATININE 0.70 11/10/2016   BUN 11 11/10/2016   NA 138 11/10/2016   K 3.7 11/10/2016   CL 105 11/10/2016   CO2 22 04/23/2014    Anesthesia Physical Anesthesia Plan  ASA: II  Anesthesia Plan: Spinal   Post-op Pain Management:  Regional for Post-op pain   Induction:   PONV Risk Score and Plan: Treatment may vary due to age or medical condition  Airway Management Planned: Mask, Natural Airway and Nasal Cannula  Additional Equipment:   Intra-op Plan:   Post-operative Plan:   Informed Consent: I have reviewed the patients History and Physical, chart, labs and discussed the procedure including the risks, benefits and alternatives for the proposed anesthesia with the patient or authorized representative who has indicated his/her understanding and acceptance.     Plan Discussed with: CRNA  Anesthesia Plan Comments:         Anesthesia Quick Evaluation

## 2017-10-01 ENCOUNTER — Encounter (HOSPITAL_COMMUNITY): Payer: Self-pay | Admitting: *Deleted

## 2017-10-01 ENCOUNTER — Encounter (HOSPITAL_COMMUNITY)
Admission: RE | Disposition: A | Discharge status: Home / Self Care | Payer: Self-pay | Source: Ambulatory Visit | Attending: Obstetrics & Gynecology

## 2017-10-01 ENCOUNTER — Inpatient Hospital Stay (HOSPITAL_COMMUNITY)
Admission: RE | Admit: 2017-10-01 | Discharge: 2017-10-03 | DRG: 785 | Disposition: A | Discharge status: Home / Self Care | Payer: Managed Care, Other (non HMO) | Source: Ambulatory Visit | Attending: Obstetrics & Gynecology | Admitting: Obstetrics & Gynecology

## 2017-10-01 ENCOUNTER — Inpatient Hospital Stay (HOSPITAL_COMMUNITY): Payer: Managed Care, Other (non HMO) | Admitting: Anesthesiology

## 2017-10-01 DIAGNOSIS — Z87891 Personal history of nicotine dependence: Secondary | ICD-10-CM

## 2017-10-01 DIAGNOSIS — Z98891 History of uterine scar from previous surgery: Secondary | ICD-10-CM

## 2017-10-01 DIAGNOSIS — R12 Heartburn: Secondary | ICD-10-CM | POA: Diagnosis present

## 2017-10-01 DIAGNOSIS — O34211 Maternal care for low transverse scar from previous cesarean delivery: Secondary | ICD-10-CM | POA: Diagnosis present

## 2017-10-01 DIAGNOSIS — Z3A39 39 weeks gestation of pregnancy: Secondary | ICD-10-CM | POA: Diagnosis not present

## 2017-10-01 DIAGNOSIS — O99824 Streptococcus B carrier state complicating childbirth: Secondary | ICD-10-CM | POA: Diagnosis present

## 2017-10-01 DIAGNOSIS — Z302 Encounter for sterilization: Secondary | ICD-10-CM | POA: Diagnosis not present

## 2017-10-01 DIAGNOSIS — O9962 Diseases of the digestive system complicating childbirth: Secondary | ICD-10-CM | POA: Diagnosis present

## 2017-10-01 LAB — RPR: RPR Ser Ql: NONREACTIVE

## 2017-10-01 SURGERY — Surgical Case
Anesthesia: Spinal

## 2017-10-01 MED ORDER — OSELTAMIVIR PHOSPHATE 75 MG PO CAPS
75.0000 mg | ORAL_CAPSULE | Freq: Two times a day (BID) | ORAL | Status: DC
  Administered 2017-10-01 – 2017-10-03 (×4): 75 mg via ORAL
  Filled 2017-10-01 (×4): qty 1

## 2017-10-01 MED ORDER — ONDANSETRON HCL 4 MG/2ML IJ SOLN
INTRAMUSCULAR | Status: AC
  Filled 2017-10-01: qty 2

## 2017-10-01 MED ORDER — NALOXONE HCL 4 MG/10ML IJ SOLN: 1.0000 ug/kg/h | INTRAVENOUS | Status: DC | PRN

## 2017-10-01 MED ORDER — NALBUPHINE HCL 10 MG/ML IJ SOLN: 5.0000 mg | Freq: Once | INTRAMUSCULAR | Status: DC | PRN

## 2017-10-01 MED ORDER — MORPHINE SULFATE (PF) 0.5 MG/ML IJ SOLN
INTRAMUSCULAR | Status: AC
  Filled 2017-10-01: qty 10

## 2017-10-01 MED ORDER — OXYCODONE-ACETAMINOPHEN 5-325 MG PO TABS
1.0000 | ORAL_TABLET | ORAL | Status: DC | PRN
  Administered 2017-10-01 – 2017-10-03 (×3): 1 via ORAL
  Filled 2017-10-01 (×3): qty 1

## 2017-10-01 MED ORDER — SODIUM CHLORIDE 0.9% FLUSH: 3.0000 mL | INTRAVENOUS | Status: DC | PRN

## 2017-10-01 MED ORDER — NALOXONE HCL 0.4 MG/ML IJ SOLN: 0.4000 mg | INTRAMUSCULAR | Status: DC | PRN

## 2017-10-01 MED ORDER — OXYTOCIN 40 UNITS IN LACTATED RINGERS INFUSION - SIMPLE MED: 2.5000 [IU]/h | INTRAVENOUS | Status: DC

## 2017-10-01 MED ORDER — OXYTOCIN 10 UNIT/ML IJ SOLN
INTRAVENOUS | Status: DC | PRN
  Administered 2017-10-01: 40 [IU] via INTRAVENOUS

## 2017-10-01 MED ORDER — COCONUT OIL OIL: 1.0000 "application " | TOPICAL_OIL | Status: DC | PRN

## 2017-10-01 MED ORDER — FERROUS SULFATE 325 (65 FE) MG PO TABS
325.0000 mg | ORAL_TABLET | Freq: Every day | ORAL | Status: DC
  Administered 2017-10-02 – 2017-10-03 (×2): 325 mg via ORAL
  Filled 2017-10-01 (×2): qty 1

## 2017-10-01 MED ORDER — TETANUS-DIPHTH-ACELL PERTUSSIS 5-2.5-18.5 LF-MCG/0.5 IM SUSP: 0.5000 mL | Freq: Once | INTRAMUSCULAR | Status: DC

## 2017-10-01 MED ORDER — DIPHENHYDRAMINE HCL 25 MG PO CAPS: 25.0000 mg | ORAL_CAPSULE | ORAL | Status: DC | PRN

## 2017-10-01 MED ORDER — MEPERIDINE HCL 25 MG/ML IJ SOLN: 6.2500 mg | INTRAMUSCULAR | Status: DC | PRN

## 2017-10-01 MED ORDER — KETOROLAC TROMETHAMINE 30 MG/ML IJ SOLN
INTRAMUSCULAR | Status: AC
Start: 2017-10-01 — End: 2017-10-02
  Filled 2017-10-01: qty 1

## 2017-10-01 MED ORDER — WITCH HAZEL-GLYCERIN EX PADS: 1.0000 "application " | MEDICATED_PAD | CUTANEOUS | Status: DC | PRN

## 2017-10-01 MED ORDER — MORPHINE SULFATE (PF) 0.5 MG/ML IJ SOLN
INTRAMUSCULAR | Status: DC | PRN
  Administered 2017-10-01: .2 mg via INTRATHECAL

## 2017-10-01 MED ORDER — DIPHENHYDRAMINE HCL 25 MG PO CAPS: 25.0000 mg | ORAL_CAPSULE | Freq: Four times a day (QID) | ORAL | Status: DC | PRN

## 2017-10-01 MED ORDER — SCOPOLAMINE 1 MG/3DAYS TD PT72: 1.0000 | MEDICATED_PATCH | Freq: Once | TRANSDERMAL | Status: DC

## 2017-10-01 MED ORDER — PHENYLEPHRINE 8 MG IN D5W 100 ML (0.08MG/ML) PREMIX OPTIME
INJECTION | INTRAVENOUS | Status: DC | PRN
  Administered 2017-10-01: 60 ug/min via INTRAVENOUS

## 2017-10-01 MED ORDER — BUPIVACAINE IN DEXTROSE 0.75-8.25 % IT SOLN
INTRATHECAL | Status: DC | PRN
  Administered 2017-10-01: 1.6 mg via INTRATHECAL

## 2017-10-01 MED ORDER — IBUPROFEN 600 MG PO TABS
600.0000 mg | ORAL_TABLET | Freq: Four times a day (QID) | ORAL | Status: DC
  Administered 2017-10-02 – 2017-10-03 (×6): 600 mg via ORAL
  Filled 2017-10-01 (×6): qty 1

## 2017-10-01 MED ORDER — SIMETHICONE 80 MG PO CHEW: 80.0000 mg | CHEWABLE_TABLET | ORAL | Status: DC | PRN

## 2017-10-01 MED ORDER — MENTHOL 3 MG MT LOZG: 1.0000 | LOZENGE | OROMUCOSAL | Status: DC | PRN

## 2017-10-01 MED ORDER — STERILE WATER FOR IRRIGATION IR SOLN
Status: DC | PRN
  Administered 2017-10-01: 1000 mL

## 2017-10-01 MED ORDER — NALBUPHINE HCL 10 MG/ML IJ SOLN: 5.0000 mg | INTRAMUSCULAR | Status: DC | PRN

## 2017-10-01 MED ORDER — EPHEDRINE SULFATE 50 MG/ML IJ SOLN
INTRAMUSCULAR | Status: DC | PRN
  Administered 2017-10-01: 10 mg via INTRAVENOUS

## 2017-10-01 MED ORDER — OXYCODONE-ACETAMINOPHEN 5-325 MG PO TABS
2.0000 | ORAL_TABLET | ORAL | Status: DC | PRN
  Administered 2017-10-02 – 2017-10-03 (×3): 2 via ORAL
  Filled 2017-10-01 (×3): qty 2

## 2017-10-01 MED ORDER — HYDROMORPHONE HCL 1 MG/ML IJ SOLN
INTRAMUSCULAR | Status: AC
Start: 2017-10-01 — End: 2017-10-02
  Filled 2017-10-01: qty 0.5

## 2017-10-01 MED ORDER — HYDROMORPHONE HCL 1 MG/ML IJ SOLN
0.5000 mg | INTRAMUSCULAR | Status: DC | PRN
  Administered 2017-10-01: 0.5 mg via INTRAVENOUS

## 2017-10-01 MED ORDER — DIBUCAINE 1 % RE OINT: 1.0000 "application " | TOPICAL_OINTMENT | RECTAL | Status: DC | PRN

## 2017-10-01 MED ORDER — PHENYLEPHRINE 40 MCG/ML (10ML) SYRINGE FOR IV PUSH (FOR BLOOD PRESSURE SUPPORT)
PREFILLED_SYRINGE | INTRAVENOUS | Status: AC
  Filled 2017-10-01: qty 10

## 2017-10-01 MED ORDER — EPHEDRINE 5 MG/ML INJ
INTRAVENOUS | Status: AC
  Filled 2017-10-01: qty 10

## 2017-10-01 MED ORDER — CEFAZOLIN SODIUM-DEXTROSE 2-4 GM/100ML-% IV SOLN
2.0000 g | INTRAVENOUS | Status: AC
  Administered 2017-10-01: 2 g via INTRAVENOUS
  Filled 2017-10-01: qty 100

## 2017-10-01 MED ORDER — PHENYLEPHRINE HCL 10 MG/ML IJ SOLN
INTRAMUSCULAR | Status: DC | PRN
  Administered 2017-10-01 (×2): 40 ug via INTRAVENOUS

## 2017-10-01 MED ORDER — SIMETHICONE 80 MG PO CHEW
80.0000 mg | CHEWABLE_TABLET | Freq: Three times a day (TID) | ORAL | Status: DC
  Administered 2017-10-02 – 2017-10-03 (×4): 80 mg via ORAL
  Filled 2017-10-01 (×4): qty 1

## 2017-10-01 MED ORDER — ONDANSETRON HCL 4 MG/2ML IJ SOLN: 4.0000 mg | Freq: Three times a day (TID) | INTRAMUSCULAR | Status: DC | PRN

## 2017-10-01 MED ORDER — PHENYLEPHRINE 8 MG IN D5W 100 ML (0.08MG/ML) PREMIX OPTIME
INJECTION | INTRAVENOUS | Status: AC
  Filled 2017-10-01: qty 100

## 2017-10-01 MED ORDER — LACTATED RINGERS IV SOLN
INTRAVENOUS | Status: DC | PRN
  Administered 2017-10-01: 14:00:00 via INTRAVENOUS

## 2017-10-01 MED ORDER — DIPHENHYDRAMINE HCL 50 MG/ML IJ SOLN: 12.5000 mg | INTRAMUSCULAR | Status: DC | PRN

## 2017-10-01 MED ORDER — ONDANSETRON HCL 4 MG/2ML IJ SOLN
INTRAMUSCULAR | Status: DC | PRN
  Administered 2017-10-01: 4 mg via INTRAVENOUS

## 2017-10-01 MED ORDER — LACTATED RINGERS IV SOLN
INTRAVENOUS | Status: DC
  Administered 2017-10-01 (×3): via INTRAVENOUS

## 2017-10-01 MED ORDER — SIMETHICONE 80 MG PO CHEW
80.0000 mg | CHEWABLE_TABLET | ORAL | Status: DC
  Administered 2017-10-02 – 2017-10-03 (×2): 80 mg via ORAL
  Filled 2017-10-01 (×2): qty 1

## 2017-10-01 MED ORDER — KETOROLAC TROMETHAMINE 30 MG/ML IJ SOLN: 30.0000 mg | Freq: Four times a day (QID) | INTRAMUSCULAR | Status: DC | PRN

## 2017-10-01 MED ORDER — ZOLPIDEM TARTRATE 5 MG PO TABS: 5.0000 mg | ORAL_TABLET | Freq: Every evening | ORAL | Status: DC | PRN

## 2017-10-01 MED ORDER — SENNOSIDES-DOCUSATE SODIUM 8.6-50 MG PO TABS
2.0000 | ORAL_TABLET | ORAL | Status: DC
  Administered 2017-10-02 – 2017-10-03 (×2): 2 via ORAL
  Filled 2017-10-01 (×2): qty 2

## 2017-10-01 MED ORDER — PRENATAL MULTIVITAMIN CH
1.0000 | ORAL_TABLET | Freq: Every day | ORAL | Status: DC
  Administered 2017-10-02: 1 via ORAL
  Filled 2017-10-01: qty 1

## 2017-10-01 MED ORDER — ACETAMINOPHEN 325 MG PO TABS
650.0000 mg | ORAL_TABLET | ORAL | Status: DC | PRN
  Filled 2017-10-01: qty 2

## 2017-10-01 MED ORDER — OXYTOCIN 10 UNIT/ML IJ SOLN
INTRAMUSCULAR | Status: AC
  Filled 2017-10-01: qty 4

## 2017-10-01 MED ORDER — LACTATED RINGERS IV SOLN
INTRAVENOUS | Status: DC
  Administered 2017-10-01 – 2017-10-02 (×2): via INTRAVENOUS

## 2017-10-01 MED ORDER — GLYCOPYRROLATE 0.2 MG/ML IJ SOLN
INTRAMUSCULAR | Status: DC | PRN
  Administered 2017-10-01: 0.2 mg via INTRAVENOUS

## 2017-10-01 MED ORDER — KETOROLAC TROMETHAMINE 30 MG/ML IJ SOLN
30.0000 mg | Freq: Four times a day (QID) | INTRAMUSCULAR | Status: DC | PRN
  Administered 2017-10-01: 30 mg via INTRAMUSCULAR

## 2017-10-01 SURGICAL SUPPLY — 38 items
ADH SKN CLS APL DERMABOND .7 (GAUZE/BANDAGES/DRESSINGS)
APL SKNCLS STERI-STRIP NONHPOA (GAUZE/BANDAGES/DRESSINGS) ×1
BENZOIN TINCTURE PRP APPL 2/3 (GAUZE/BANDAGES/DRESSINGS) ×2 IMPLANT
CLAMP CORD UMBIL (MISCELLANEOUS) IMPLANT
CLOSURE STERI STRIP 1/2 X4 (GAUZE/BANDAGES/DRESSINGS) ×2 IMPLANT
CLOTH BEACON ORANGE TIMEOUT ST (SAFETY) ×2 IMPLANT
DERMABOND ADVANCED (GAUZE/BANDAGES/DRESSINGS)
DERMABOND ADVANCED .7 DNX12 (GAUZE/BANDAGES/DRESSINGS) IMPLANT
DRSG OPSITE POSTOP 4X10 (GAUZE/BANDAGES/DRESSINGS) ×2 IMPLANT
DURAPREP 26ML APPLICATOR (WOUND CARE) ×2 IMPLANT
ELECT REM PT RETURN 9FT ADLT (ELECTROSURGICAL) ×2
ELECTRODE REM PT RTRN 9FT ADLT (ELECTROSURGICAL) ×1 IMPLANT
EXTRACTOR VACUUM KIWI (MISCELLANEOUS) IMPLANT
GLOVE BIO SURGEON STRL SZ 6 (GLOVE) ×2 IMPLANT
GLOVE BIOGEL PI IND STRL 6 (GLOVE) ×2 IMPLANT
GLOVE BIOGEL PI IND STRL 7.0 (GLOVE) ×1 IMPLANT
GLOVE BIOGEL PI INDICATOR 6 (GLOVE) ×2
GLOVE BIOGEL PI INDICATOR 7.0 (GLOVE) ×1
GOWN STRL REUS W/TWL LRG LVL3 (GOWN DISPOSABLE) ×4 IMPLANT
KIT ABG SYR 3ML LUER SLIP (SYRINGE) ×2 IMPLANT
NDL HYPO 25X5/8 SAFETYGLIDE (NEEDLE) ×1 IMPLANT
NDL SAFETY ECLIPSE 18X1.5 (NEEDLE) IMPLANT
NEEDLE HYPO 18GX1.5 SHARP (NEEDLE) ×2
NEEDLE HYPO 25X5/8 SAFETYGLIDE (NEEDLE) ×2 IMPLANT
NS IRRIG 1000ML POUR BTL (IV SOLUTION) ×2 IMPLANT
PACK C SECTION WH (CUSTOM PROCEDURE TRAY) ×2 IMPLANT
PAD OB MATERNITY 4.3X12.25 (PERSONAL CARE ITEMS) ×2 IMPLANT
PENCIL SMOKE EVAC W/HOLSTER (ELECTROSURGICAL) ×2 IMPLANT
STRIP CLOSURE SKIN 1/2X4 (GAUZE/BANDAGES/DRESSINGS) IMPLANT
SUT CHROMIC 0 CTX 36 (SUTURE) ×6 IMPLANT
SUT MON AB 2-0 CT1 27 (SUTURE) ×2 IMPLANT
SUT PDS AB 0 CT1 27 (SUTURE) IMPLANT
SUT PLAIN 0 NONE (SUTURE) IMPLANT
SUT VIC AB 0 CT1 36 (SUTURE) IMPLANT
SUT VIC AB 4-0 KS 27 (SUTURE) IMPLANT
SYR 50ML LL SCALE MARK (SYRINGE) ×1 IMPLANT
TOWEL OR 17X24 6PK STRL BLUE (TOWEL DISPOSABLE) ×2 IMPLANT
TRAY FOLEY BAG SILVER LF 14FR (SET/KITS/TRAYS/PACK) IMPLANT

## 2017-10-01 NOTE — Op Note (Signed)
[name] PROCEDURE DATE: 10/01/2017  PREOPERATIVE DIAGNOSIS: Intrauterine pregnancy at  [redacted]w[redacted]d weeks gestation, previous C/S x 2, desires sterility  POSTOPERATIVE DIAGNOSIS: The same  PROCEDURE:  Repeat Low Transverse Cesarean Section with Bilateral Tubal Ligation, backfill of bladder with sterile milk  SURGEON:  Dr. Mitchel Honour  INDICATIONS: [name] is a 27 y.o. G3P2002 at [redacted]w[redacted]d scheduled for cesarean section and bilateral tubal ligation secondary to previous C/S x 2 and desires sterility.  The risks of cesarean section discussed with the patient included but were not limited to: bleeding which may require transfusion or reoperation; infection which may require antibiotics; injury to bowel, bladder, ureters or other surrounding organs; injury to the fetus; need for additional procedures including hysterectomy in the event of a life-threatening hemorrhage; placental abnormalities wth subsequent pregnancies, incisional problems, thromboembolic phenomenon and other postoperative/anesthesia complications. She understands the risk of failure of BTL, ectopic and regret. The patient concurred with the proposed plan, giving informed written consent for the procedure.    FINDINGS:  Viable female infant in cephalic presentation, APGARs 8,9: weight pending  Clear amniotic fluid.  Intact placenta, three vessel cord.  Grossly normal uterus, ovaries and fallopian tubes.  Extensive scarring of the bladder to the uterine corpus.  .   ANESTHESIA:  Spinal ESTIMATED BLOOD LOSS: 1054 mL ml SPECIMENS: Placenta sent to L&D COMPLICATIONS: None immediate  PROCEDURE IN DETAIL:  The patient received intravenous antibiotics and had sequential compression devices applied to her lower extremities while in the preoperative area.  She was then taken to the operating room where spinal anesthesia was administered and was found to be adequate. She was then placed in a dorsal supine position with a leftward tilt, and  prepped and draped in a sterile manner.  A foley catheter was placed into her bladder and attached to constant gravity.  After an adequate timeout was performed, a Pfannenstiel skin incision was made with scalpel and carried through to the underlying layer of fascia. The fascia was incised in the midline and this incision was extended bilaterally using the Mayo scissors. Kocher clamps were applied to the superior aspect of the fascial incision and the underlying rectus muscles were dissected off bluntly. A similar process was carried out on the inferior aspect of the facial incision. The rectus muscles were separated in the midline bluntly and the peritoneum was entered bluntly.  Bladder adhesions were taken down sharply and developed bluntly and sharply.   A transverse hysterotomy was made with a scalpel and extended bilaterally bluntly. The bladder blade was then removed. The infant was successfully delivered, and cord was clamped and cut and infant was handed over to awaiting neonatology team. Uterine massage was then administered and the placenta delivered intact with three-vessel cord. The uterus was cleared of clot and debris.  The hysterotomy was closed with 0 Chromic. The bladder was backfilled with sterile milk with no spill noted.  Attention was turned to the fallopian tubes.  The right tube was doubly tied with plain gut and the knuckle excised with hemostasis.  The contralateral tube was treated in the same way.  Hemostasis was ensured.  The peritoneum and rectus muscles were noted to be hemostatic and were reapproximated using 2-0 monocryl in a running fashion.  The fascia was closed with 0-PDS in a running fashion with good restoration of anatomy.  The subcutaneus tissue was copiously irrigated.  The skin was closed with 4-0 vicryl in a subcuticular fashion.  Pt tolerated the procedure will.  All counts were correct x2.  Pt went to the recovery room in stable condition.

## 2017-10-01 NOTE — Anesthesia Postprocedure Evaluation (Signed)
Anesthesia Post Note  Patient: [name]  Procedure(s) Performed: CESAREAN SECTION WITH BILATERAL TUBAL LIGATION (N/A )     Patient location during evaluation: PACU Anesthesia Type: Spinal Level of consciousness: oriented and awake and alert Pain management: pain level controlled Vital Signs Assessment: post-procedure vital signs reviewed and stable Respiratory status: spontaneous breathing, respiratory function stable and patient connected to nasal cannula oxygen Cardiovascular status: blood pressure returned to baseline and stable Postop Assessment: no headache, no backache and no apparent nausea or vomiting Anesthetic complications: no    Last Vitals:  Vitals:   10/01/17 1515 10/01/17 1548  BP: (!) 90/55 (!) 92/46  Pulse: 73 76  Resp: 14 18  Temp:  36.7 C  SpO2: 100% 100%    Last Pain:  Vitals:   10/01/17 1600  TempSrc:   PainSc: 3    Pain Goal: Patients Stated Pain Goal: 4 (10/01/17 1103)               Trevor IhaStephen A Houser

## 2017-10-01 NOTE — Anesthesia Procedure Notes (Signed)
Spinal  Patient location during procedure: OB Start time: 10/01/2017 1:00 PM End time: 10/01/2017 1:10 PM Staffing Anesthesiologist: Trevor IhaHouser, Joseeduardo Brix A, MD Performed: anesthesiologist  Preanesthetic Checklist Completed: patient identified, surgical consent, pre-op evaluation, timeout performed, IV checked, risks and benefits discussed and monitors and equipment checked Spinal Block Patient position: sitting Prep: site prepped and draped and DuraPrep Patient monitoring: heart rate, cardiac monitor, continuous pulse ox and blood pressure Approach: midline Location: L3-4 Injection technique: single-shot Needle Needle type: Pencan  Needle gauge: 24 G Needle length: 10 cm Assessment Sensory level: T4

## 2017-10-01 NOTE — Transfer of Care (Signed)
Immediate Anesthesia Transfer of Care Note  Patient: [name]  Procedure(s) Performed: CESAREAN SECTION WITH BILATERAL TUBAL LIGATION (N/A )  Patient Location: PACU  Anesthesia Type:Spinal  Level of Consciousness: awake, alert , oriented and patient cooperative  Airway & Oxygen Therapy: Patient Spontanous Breathing  Post-op Assessment: Report given to RN and Post -op Vital signs reviewed and stable  Post vital signs: Reviewed and stable  Last Vitals:  Vitals:   10/01/17 1103  BP: 112/68  Resp: 18  Temp: 36.7 C    Last Pain:  Vitals:   10/01/17 1103  TempSrc: Oral      Patients Stated Pain Goal: 4 (10/01/17 1103)  Complications: No apparent anesthesia complications

## 2017-10-01 NOTE — Addendum Note (Signed)
Addendum  created 10/01/17 1701 by Graciela HusbandsFussell, Doniel Maiello O, CRNA   Sign clinical note

## 2017-10-01 NOTE — Progress Notes (Signed)
No change to H&P.  Katelan Hirt, DO 

## 2017-10-01 NOTE — Anesthesia Postprocedure Evaluation (Signed)
Anesthesia Post Note  Patient: [name]  Procedure(s) Performed: CESAREAN SECTION WITH BILATERAL TUBAL LIGATION (N/A )     Patient location during evaluation: Mother Baby Anesthesia Type: Spinal Level of consciousness: awake and alert and oriented Pain management: satisfactory to patient Vital Signs Assessment: post-procedure vital signs reviewed and stable Respiratory status: respiratory function stable and spontaneous breathing Cardiovascular status: blood pressure returned to baseline Postop Assessment: no headache, no backache, spinal receding, patient able to bend at knees and adequate PO intake Anesthetic complications: no    Last Vitals:  Vitals:   10/01/17 1515 10/01/17 1548  BP: (!) 90/55 (!) 92/46  Pulse: 73 76  Resp: 14 18  Temp:  36.7 C  SpO2: 100% 100%    Last Pain:  Vitals:   10/01/17 1600  TempSrc:   PainSc: 3    Pain Goal: Patients Stated Pain Goal: 4 (10/01/17 1103)               Seraphina Mitchner

## 2017-10-02 ENCOUNTER — Other Ambulatory Visit: Payer: Self-pay

## 2017-10-02 LAB — CBC
HCT: 24.6 % — ABNORMAL LOW (ref 36.0–46.0)
Hemoglobin: 8.4 g/dL — ABNORMAL LOW (ref 12.0–15.0)
MCH: 28.8 pg (ref 26.0–34.0)
MCHC: 34.1 g/dL (ref 30.0–36.0)
MCV: 84.2 fL (ref 78.0–100.0)
PLATELETS: 228 10*3/uL (ref 150–400)
RBC: 2.92 MIL/uL — AB (ref 3.87–5.11)
RDW: 14.3 % (ref 11.5–15.5)
WBC: 10.4 10*3/uL (ref 4.0–10.5)

## 2017-10-02 NOTE — Progress Notes (Signed)
CSW received consult for hx of Anxiety and Depression.  CSW met with MOB to offer support and complete assessment.     When CSW arrived, MOB was eating lunch and FOB was holding infant while infant was crying.  FOB appeared comfortable and confident in caring for infant. CSW explained CSW's role and MOB gave CSW  permission to complete the assessment while FOB was present.   MOB was polite, forthcoming, and receptive to meeting with CSW. FOB was also polite and supportive of MOB.  CSW asked about MOB's MH hx and MOB denied having a dx of anxiety, however, reported feeling overly anxious most days than not.  MOB reported having this feelings for the past 11 years.  CSW encouraged MOB to seek outpatient counseling for a clinical assessment and formal dx; MOB was not interested and declined resources. MOB stated, I will be ok." CSW shared the importance of MOB being healthy physically and mentally in order to provide infant with a healthy start; MOB agreed.    CSW provided education regarding the baby blues period vs. perinatal mood disorders, discussed treatment and gave resources for mental health follow up if concerns arise.  CSW recommends self-evaluation during the postpartum time period using the New Mom Checklist from Postpartum Progress and encouraged MOB to contact a medical professional if symptoms are noted at any time.  MOB did not present with any acute signs and symtoms. MOB denied having any PPD symtoms with MOB's oldest child. MOB agreed to seek help with OB provider  if needed.    CSW identifies no further need for intervention and no barriers to discharge at this time.  Moses Odoherty Boyd-Gilyard, MSW, LCSW Clinical Social Work [phone] 

## 2017-10-02 NOTE — Lactation Note (Signed)
This note was copied from a baby's chart. Lactation Consultation Note Baby 11 hrs old. Has been wanting to BF a lot per mom. Mom BF her 8911 and 339 yr old for 8 weeks until she went back to work. Mom will BF this baby for 10 weeks to allow 2 weeks prior to going to work. Discussed pumping and obtaining milk storage.  Mom has everted nipples. In football position obtained a deep latch. Heard swallows. Discussed positions, support, comfort, cluster feeding, newborn feeding habits, STS< I&O.  Encouraged mom to call for assistance if needed.  [facility] brochure given w/resources, support groups and LC services.  Patient Name: Yvette Rowan BlaseJessica [name] ZOXWR'UToday's Date: 10/02/2017 Reason for consult: Initial assessment   Maternal Data Has patient been taught Hand Expression?: Yes Does the patient have breastfeeding experience prior to this delivery?: Yes  Feeding Feeding Type: Breast Fed Length of feed: 10 min  LATCH Score Latch: Grasps breast easily, tongue down, lips flanged, rhythmical sucking.  Audible Swallowing: Spontaneous and intermittent  Type of Nipple: Everted at rest and after stimulation  Comfort (Breast/Nipple): Soft / non-tender  Hold (Positioning): Assistance needed to correctly position infant at breast and maintain latch.  LATCH Score: 9  Interventions Interventions: Breast feeding basics reviewed;Assisted with latch;Breast compression;Skin to skin;Breast massage;Adjust position;Support pillows;Hand express;Position options  Lactation Tools Discussed/Used WIC Program: Yes   Consult Status Consult Status: Follow-up Date: 10/03/17    Yvette DancerCARVER, [name] 10/02/2017, 1:15 AM

## 2017-10-02 NOTE — Progress Notes (Signed)
POD # 1 PATIENT TESTED POSITIVE FOR FLU A THIS PAST WEEKEND AND ENTIRE FAMILY INCLUDING PATIENT HAVE BEEN ON TAMIFLU  Patient is feeling fine no complaints. No circ here per patient  BP (!) 86/49 (BP Location: Left Arm) Comment: Pt asymptomatic  Pulse 89   Temp 98.4 F (36.9 C) (Axillary)   Resp 18   Ht 5\' 2"  (1.575 m)   Wt 75.8 kg (167 lb 3.2 oz)   LMP 12/19/2016 (Approximate)   SpO2 96%   Breastfeeding? Unknown   BMI 30.58 kg/m  Results for orders placed or performed during the hospital encounter of 10/01/17 (from the past 24 hour(s))  CBC     Status: Abnormal   Collection Time: 10/02/17  4:34 AM  Result Value Ref Range   WBC 10.4 4.0 - 10.5 K/uL   RBC 2.92 (L) 3.87 - 5.11 MIL/uL   Hemoglobin 8.4 (L) 12.0 - 15.0 g/dL   HCT 09.824.6 (L) 11.936.0 - 14.746.0 %   MCV 84.2 78.0 - 100.0 fL   MCH 28.8 26.0 - 34.0 pg   MCHC 34.1 30.0 - 36.0 g/dL   RDW 82.914.3 56.211.5 - 13.015.5 %   Platelets 228 150 - 400 K/uL   Abdomen is soft and non tender Bandage dry  POD # 1  Doing well Routine care

## 2017-10-03 MED ORDER — OXYCODONE-ACETAMINOPHEN 5-325 MG PO TABS: 1.0000 | ORAL_TABLET | ORAL | 0 refills | Status: DC | PRN

## 2017-10-03 MED ORDER — IBUPROFEN 600 MG PO TABS: 600.0000 mg | ORAL_TABLET | Freq: Four times a day (QID) | ORAL | 0 refills | Status: DC

## 2017-10-03 NOTE — Discharge Summary (Signed)
Obstetric Discharge Summary Reason for Admission: cesarean section Prenatal Procedures: none Intrapartum Procedures: cesarean: low cervical, transverse Postpartum Procedures: none Complications-Operative and Postpartum: none Hemoglobin  Date Value Ref Range Status  10/02/2017 8.4 (L) 12.0 - 15.0 g/dL Final    Comment:    REPEATED TO VERIFY DELTA CHECK NOTED    HCT  Date Value Ref Range Status  10/02/2017 24.6 (L) 36.0 - 46.0 % Final    Physical Exam:  General: alert and cooperative Lochia:appropriate Uterine Fundus: firm Incision: healing well, no significant drainage DVT Evaluation: No evidence of DVT seen on physical exam.  Discharge Diagnoses: Term Pregnancy-delivered  Discharge Information: Date: 10/03/2017 Activity: pelvic rest Diet: routine Medications: PNV, Ibuprofen, Percocet and tamiflu, iron Condition: stable Instructions: refer to practice specific booklet Discharge to: home Follow-up Information    Morris, Megan, DO. Schedule an appointment as soon as possible for a visit in 1 week(s).   Specialty:  Obstetrics and Gynecology Contact information: [address], [address], [address] [place] [address] [phone]           Newborn Data: Live born female  Birth Weight: 8 lb 6.9 oz (3825 g) APGAR: 8, 9  Newborn Delivery   Birth date/time:  10/01/2017 13:29:00 Delivery type:  C-Section, Low Transverse C-section categorization:  Repeat     Home with mother.  Zelphia CairoGretchen Lanecia Sliva 10/03/2017, 8:09 AM

## 2017-10-03 NOTE — Lactation Note (Signed)
This note was copied from a baby's chart. Lactation Consultation Note  Patient Name: Yvette Rowan BlaseJessica [name] ZOXWR'UToday's Date: 10/03/2017 Reason for consult: Follow-up assessment  Baby 43 hours old and cueing. Mother states she recently bf for 10 min and fell asleep. Reviewed waking techniques and unwrapping baby for feedings. Mother stated baby fed often last night - described cluster feeding, the importance of feeding on demand and with both breasts per feeding, compresssing during feeding. Observed nutritive feeding for more than 10 min. Mom encouraged to feed baby 8-12 times/24 hours and with feeding cues.  Reviewed monitoring voids/stools.    Maternal Data    Feeding Feeding Type: Breast Fed  LATCH Score Latch: Grasps breast easily, tongue down, lips flanged, rhythmical sucking.  Audible Swallowing: A few with stimulation  Type of Nipple: Everted at rest and after stimulation  Comfort (Breast/Nipple): Soft / non-tender  Hold (Positioning): Assistance needed to correctly position infant at breast and maintain latch.  LATCH Score: 8  Interventions Interventions: Breast feeding basics reviewed;Assisted with latch;Skin to skin;Hand express;Breast compression  Lactation Tools Discussed/Used     Consult Status Consult Status: Complete    Hardie PulleyBerkelhammer, Ruth Boschen 10/03/2017, 9:16 AM

## 2018-05-18 IMAGING — CT CT SHOULDER*R* W/O CM
3 of 5 series · 9 of 34 positions shown, 10 images · non-contrast
Comparison: 11/10/2016

CLINICAL DATA: Right scapular fracture.

EXAM:
CT OF THE UPPER RIGHT EXTREMITY WITHOUT CONTRAST
TECHNIQUE: Multidetector CT imaging of the upper right extremity was performed
according to the standard protocol.

[Series 202: soft tissue · axial · 0.34mm/px · z∈[+260,+402]mm · 3 of 116 slices shown, 4 images]
[im 27/116  soft-tissue]
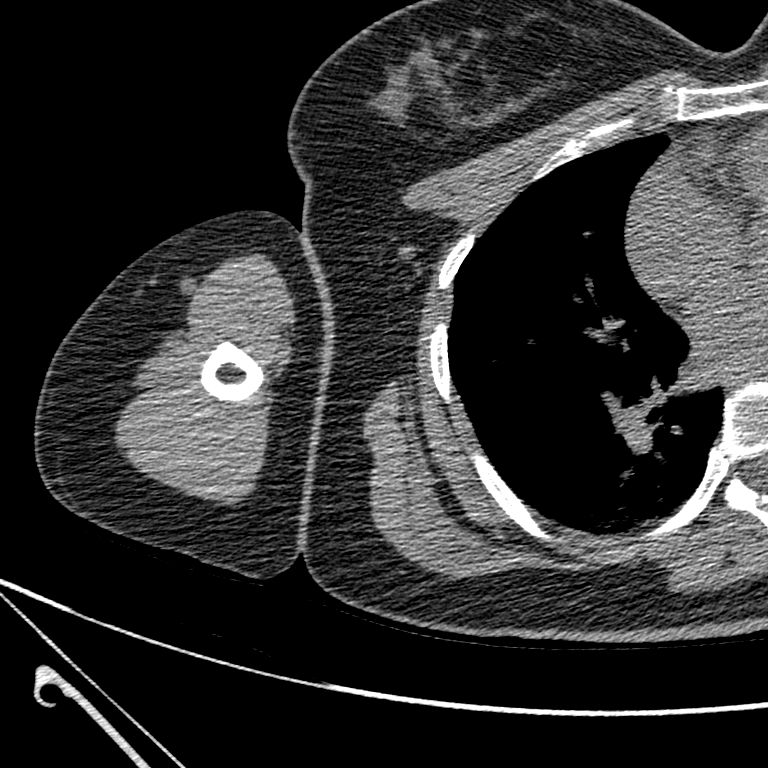
[im 27/116  bone]
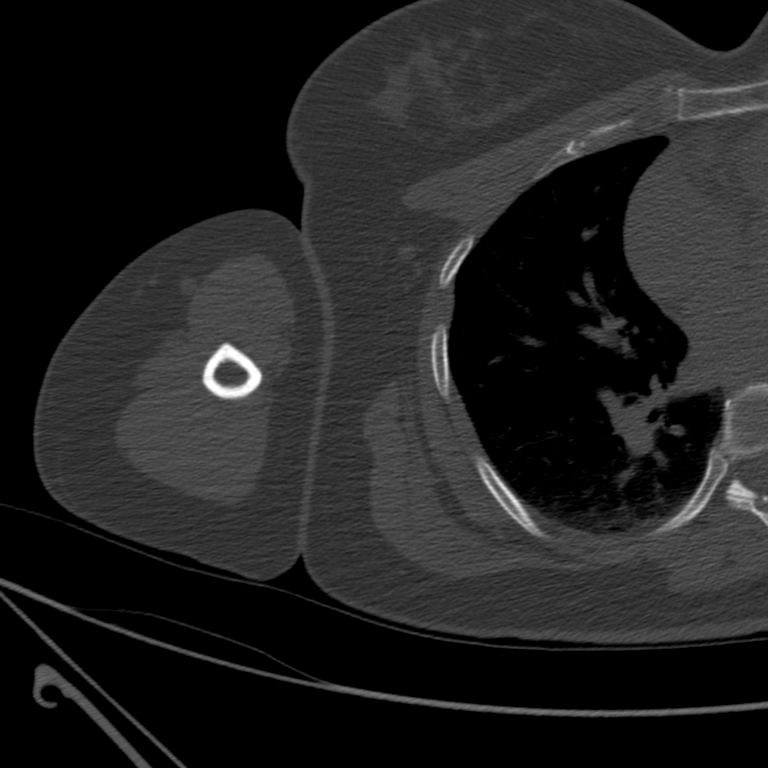
[im 62/116  bone]
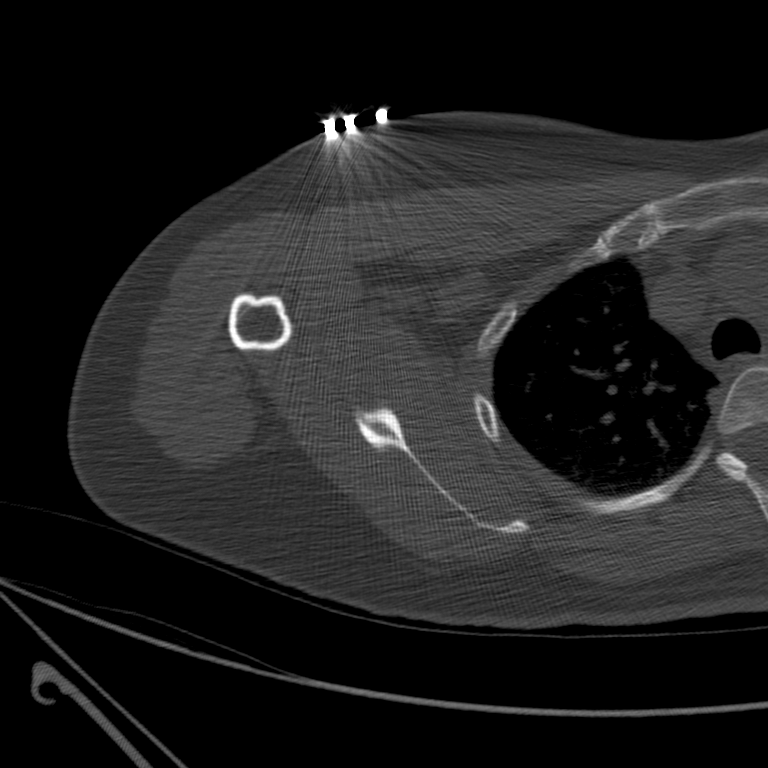
[im 98/116  bone]
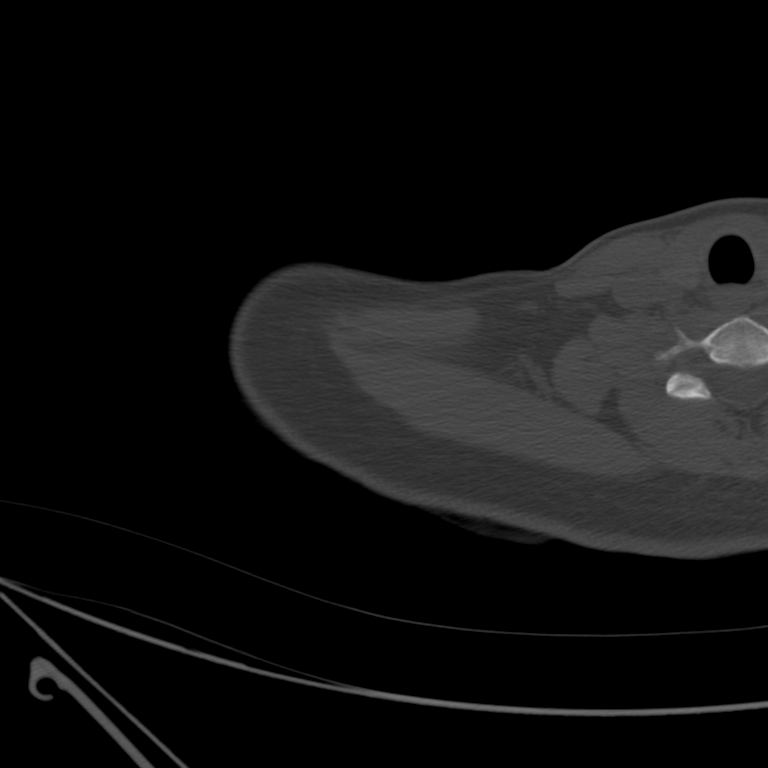

[Series 203: coronals · coronal · 0.34mm/px · 1 of 69 slices shown]
[im 35/69  bone]
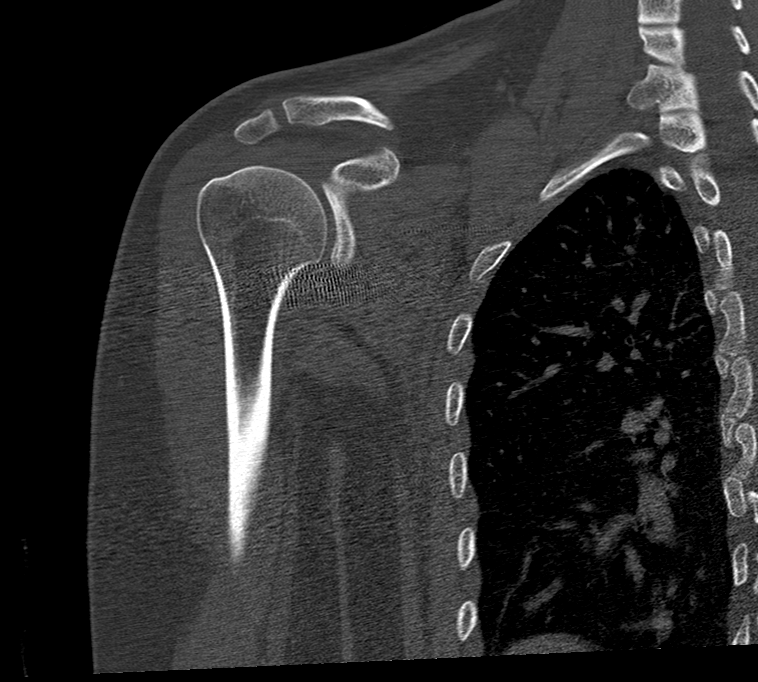

[Series 206: sagittals · sagittal · 0.34mm/px · 5 of 119 slices shown]
[im 20/119  bone]
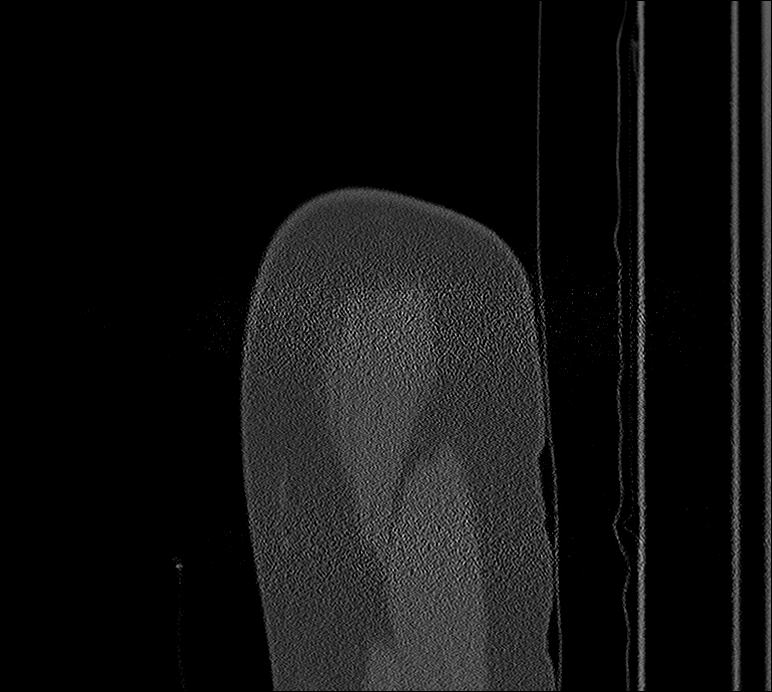
[im 40/119  bone]
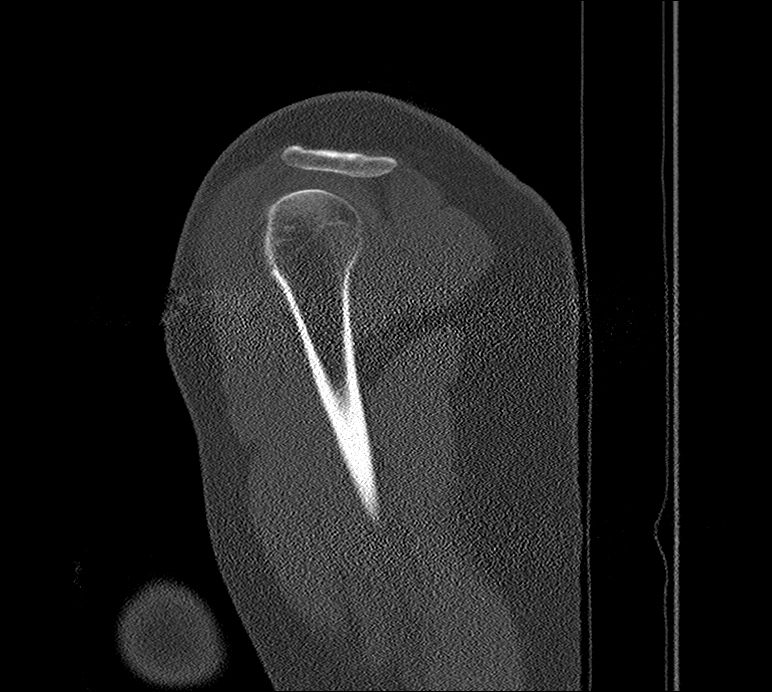
[im 60/119  bone]
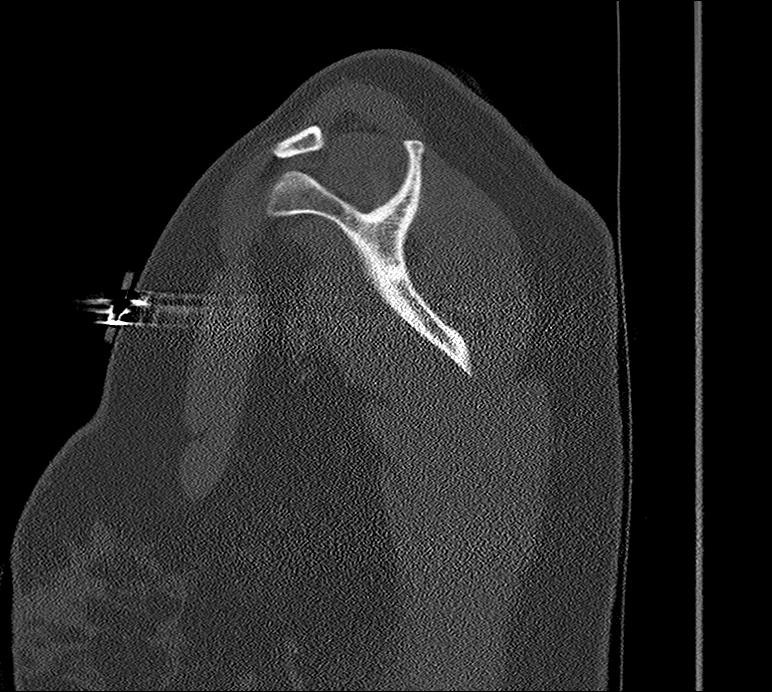
[im 79/119  bone]
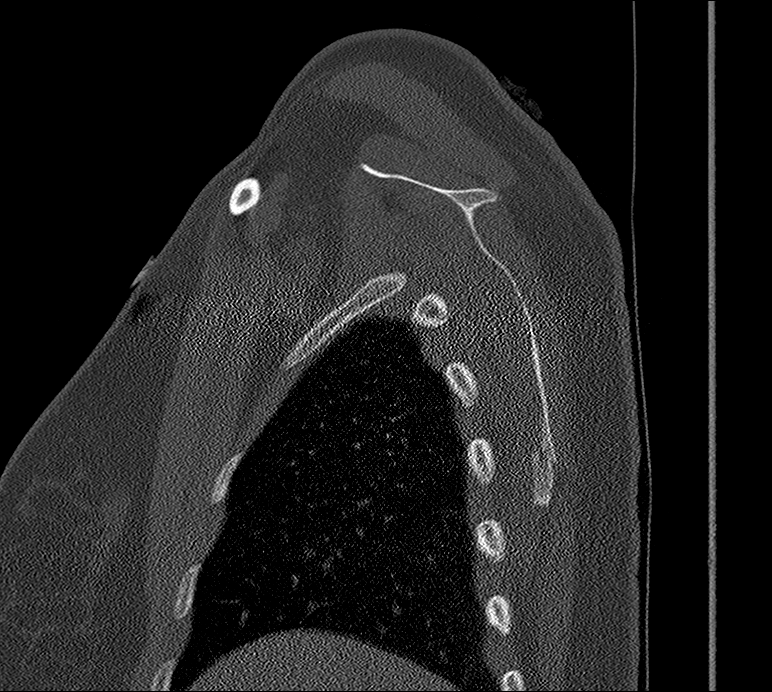
[im 99/119  bone]
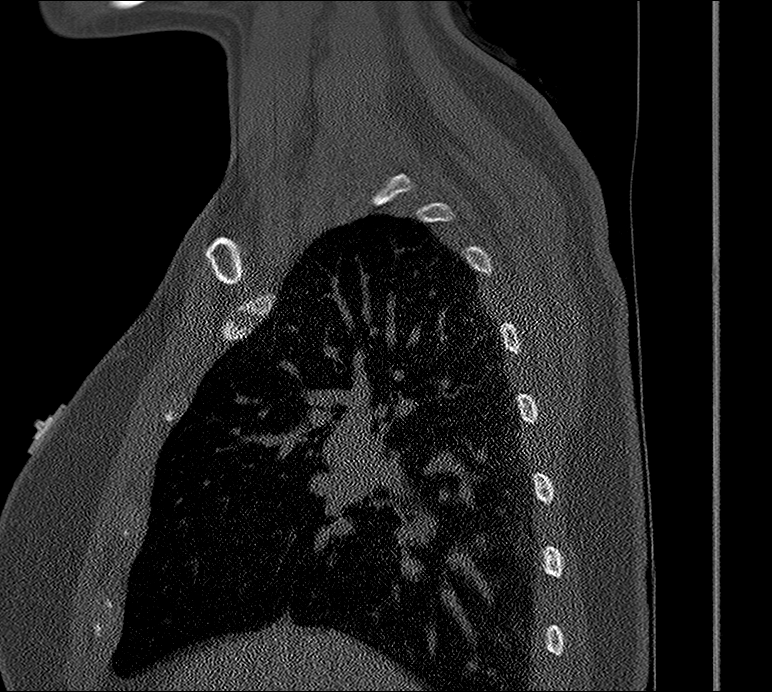

[9 of 34 positions shown; findings below may reference images not displayed]

FINDINGS: There is a displaced fracture involving the body of the scapula. The
finding is split with a displaced fragment medially and overlapping
segments distally. The glenoid is intact and the scapular spine is
normal. No fracture of the humeral head or neck.

No rib fractures.  The right lung is clear.
IMPRESSION: Displaced fractures of the scapular body.

## 2018-09-28 ENCOUNTER — Emergency Department (HOSPITAL_COMMUNITY)
Admission: EM | Admit: 2018-09-28 | Discharge: 2018-09-28 | Disposition: A | Discharge status: Home / Self Care | Payer: Medicaid Other | Attending: Emergency Medicine | Admitting: Emergency Medicine

## 2018-09-28 ENCOUNTER — Other Ambulatory Visit: Payer: Self-pay

## 2018-09-28 ENCOUNTER — Encounter (HOSPITAL_COMMUNITY): Payer: Self-pay | Admitting: Emergency Medicine

## 2018-09-28 DIAGNOSIS — Z87891 Personal history of nicotine dependence: Secondary | ICD-10-CM | POA: Diagnosis not present

## 2018-09-28 DIAGNOSIS — Z79899 Other long term (current) drug therapy: Secondary | ICD-10-CM | POA: Diagnosis not present

## 2018-09-28 DIAGNOSIS — R55 Syncope and collapse: Secondary | ICD-10-CM | POA: Diagnosis not present

## 2018-09-28 MED ORDER — SODIUM CHLORIDE 0.9 % IV BOLUS: 1000.0000 mL | Freq: Once | INTRAVENOUS | Status: DC

## 2018-09-28 NOTE — ED Provider Notes (Signed)
[facility]-EMERGENCY DEPT Provider Note   CSN: [id number] Arrival date & time: 09/28/18  0857    History   Chief Complaint Chief Complaint  Patient presents with  . Loss of Consciousness    HPI [name] is a 28 y.o. female.  She has no significant past medical history here after 3 syncopal events today.  She felt lightheaded getting up today.  She had her partner were waiting for the bus when she passed out.  He said it took about a minute or 2 for her to get back to normal again.  He said she passed out again getting on the bus and then again while on the bus.  She said this is never happened before.  She did not eat and she does not think she is been drinking very much.  Blood sugar was 117 by EMS.  She does admit to smoking some pot this morning but she says it is not unusual.  Denies any other medical complaints.  No seizure activity no incontinence no tongue biting noted.     The history is provided by the patient.  Loss of Consciousness  Episode history:  Multiple Most recent episode:  Today Progression:  Resolved Chronicity:  New Context: normal activity   Witnessed: yes   Relieved by:  Lying down Worsened by:  Nothing Ineffective treatments:  None tried Associated symptoms: dizziness   Associated symptoms: no chest pain, no fever, no focal weakness, no nausea, no recent fall, no recent injury, no recent surgery, no seizures, no shortness of breath and no vomiting     Past Medical History:  Diagnosis Date  . Anxiety   . Heartburn    TUMS PRN  . Hx of acute pyelonephritis   . Migraines     Patient Active Problem List   Diagnosis Date Noted  . S/P cesarean section 10/01/2017  . Candida vaginitis 06/06/2017    Past Surgical History:  Procedure Laterality Date  . APPENDECTOMY    . CESAREAN SECTION     X 2 [facility]  . CESAREAN SECTION WITH BILATERAL TUBAL LIGATION N/A 10/01/2017   Procedure: CESAREAN SECTION WITH BILATERAL TUBAL LIGATION;   Surgeon: Mitchel HonourMorris, Megan, DO;  Location: [facility];  Service: Obstetrics;  Laterality: N/A;  Repeat edc 3/6 NKDA Heather RNFA  . HYSTEROSCOPY N/A 05/09/2014   Procedure: HYSTEROSCOPY WITH REMOVAL OF IUD; POSSIBLE LAPAROSCOPY;  Surgeon: Mitchel HonourMegan Morris, DO;  Location: [facility];  Service: Gynecology;  Laterality: N/A;     OB History    Gravida  3   Para  3   Term  3   Preterm      AB      Living  3     SAB      TAB      Ectopic      Multiple  0   Live Births  3            Home Medications    Prior to Admission medications   Medication Sig Start Date End Date Taking? Authorizing Provider  calcium carbonate (TUMS - DOSED IN MG ELEMENTAL CALCIUM) 500 MG chewable tablet Chew 1,000 mg by mouth daily as needed for indigestion or heartburn.    [provider]  ferrous sulfate 325 (65 FE) MG EC tablet Take 325 mg by mouth daily.     [provider]  ibuprofen (ADVIL,MOTRIN) 600 MG tablet Take 1 tablet (600 mg total) by mouth every 6 (six)  hours. 10/03/17   Zelphia Cairo, MD  oxyCODONE-acetaminophen (PERCOCET/ROXICET) 5-325 MG tablet Take 1-2 tablets by mouth every 4 (four) hours as needed (pain scale 4-7). 10/03/17   Zelphia Cairo, MD  Prenatal Vit-Fe Fumarate-FA (PRENATAL MULTIVITAMIN) TABS tablet Take 1 tablet by mouth daily at 12 noon.    [provider]  terconazole (TERAZOL 3) 0.8 % vaginal cream Place 1 applicator vaginally at bedtime. Patient not taking: Reported on 10/01/2017 08/30/17   Judeth Horn, NP    Family History Family History  Problem Relation Age of Onset  . Heart attack Maternal Grandfather     Social History Social History   Tobacco Use  . Smoking status: Former Smoker    Packs/day: 0.25    Years: 4.00    Pack years: 1.00    Types: Cigarettes    Last attempt to quit: 08/05/2013    Years since quitting: 5.1  . Smokeless tobacco: Never Used  Substance Use Topics  . 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 Drug use: No    Types: Marijuana    Comment: LAST USE 04/16/14     Allergies   Patient has no known allergies.   Review of Systems Review of Systems  Constitutional: Negative for fever.  HENT: Negative for sore throat.   Eyes: Negative for visual disturbance.  Respiratory: Negative for shortness of breath.   Cardiovascular: Positive for syncope. Negative for chest pain.  Gastrointestinal: Negative for abdominal pain, nausea and vomiting.  Genitourinary: Negative for dysuria.  Musculoskeletal: Negative for neck pain.  Skin: Negative for rash.  Neurological: Positive for dizziness and syncope. Negative for focal weakness and seizures.     Physical Exam Updated Vital Signs BP 115/69 (BP Location: Left Arm)   Pulse 79   Temp 97.9 F (36.6 C) (Oral)   Resp 16   Ht 5' 2.5" (1.588 m)   Wt 65.8 kg   LMP 08/20/2018   SpO2 100%   BMI 26.10 kg/m   Physical Exam Vitals signs and nursing note reviewed.  Constitutional:      General: She is not in acute distress.    Appearance: She is well-developed.  HENT:     Head: Normocephalic and atraumatic.  Eyes:     Conjunctiva/sclera: Conjunctivae normal.  Neck:     Musculoskeletal: Neck supple.  Cardiovascular:     Rate and Rhythm: Normal rate and regular rhythm.     Heart sounds: No murmur.  Pulmonary:     Effort: Pulmonary effort is normal. No respiratory distress.     Breath sounds: Normal breath sounds.  Abdominal:     Palpations: Abdomen is soft.     Tenderness: There is no abdominal tenderness.  Musculoskeletal: Normal range of motion.        General: No signs of injury.     Right lower leg: No edema.     Left lower leg: No edema.  Skin:    General: Skin is warm and dry.     Capillary Refill: Capillary refill takes less than 2 seconds.  Neurological:     General: No focal deficit present.     Mental Status: She is alert and oriented to person, place, and time.     Sensory: No sensory deficit.      Motor: No weakness.      ED Treatments / Results  Labs (all labs ordered are listed, but only abnormal results are displayed) Labs Reviewed  BASIC METABOLIC PANEL  CBC WITH DIFFERENTIAL/PLATELET    EKG None  Radiology No results found.  Procedures Procedures (including critical care time)  Medications Ordered in ED Medications  sodium chloride 0.9 % bolus 1,000 mL (has no administration in time range)     Initial Impression / Assessment and Plan / ED Course  I have reviewed the triage vital signs and the nursing notes.  Pertinent labs & imaging results that were available during my care of the patient were reviewed by me and considered in my medical decision making (see chart for details).  Clinical Course as of Sep 29 1631  Mon Sep 28, 2018  46 28 year old female here after 3 episodes of what sounds like syncope today.  Current she feels back to baseline and her vitals are normal.  We talked about getting some blood work and getting IV fluids although the nurse came back to me later and so the patient just wishes to be discharged she get to work.  She understands return if any recurrent symptoms.   [MB]    Clinical Course User Index [MB] Terrilee Files, MD        Final Clinical Impressions(s) / ED Diagnoses   Final diagnoses:  Syncope and collapse    ED Discharge Orders    None       Terrilee Files, MD 09/28/18 [phone]

## 2018-09-28 NOTE — ED Notes (Addendum)
Pt refusing blood work at this time. Pt requesting a bottle of water and to be discharged so she can go to work.  Made Dr Charm Barges aware

## 2018-09-28 NOTE — Discharge Instructions (Addendum)
You were evaluated in the emergency department for 3 episodes of fainting.  Your vital signs were fine here.  We offered do blood tests and IV fluids but you felt well enough to want to go home.  Please return if any worsening symptoms.

## 2018-09-28 NOTE — ED Triage Notes (Signed)
Per [facility] pt from bus stop for 3 syncopal episodes today. 22g IV in left wrist. Pt hasnt eaten today but CBG 117. No orthostaitc BP changes.

## 2019-02-03 ENCOUNTER — Other Ambulatory Visit: Payer: Self-pay

## 2019-02-03 ENCOUNTER — Encounter (HOSPITAL_COMMUNITY): Payer: Self-pay

## 2019-02-03 ENCOUNTER — Ambulatory Visit (HOSPITAL_COMMUNITY)
Admission: EM | Admit: 2019-02-03 | Discharge: 2019-02-03 | Disposition: A | Discharge status: Home / Self Care | Payer: Medicaid Other | Attending: Internal Medicine | Admitting: Internal Medicine

## 2019-02-03 DIAGNOSIS — Z3202 Encounter for pregnancy test, result negative: Secondary | ICD-10-CM | POA: Diagnosis not present

## 2019-02-03 DIAGNOSIS — Z113 Encounter for screening for infections with a predominantly sexual mode of transmission: Secondary | ICD-10-CM

## 2019-02-03 LAB — POCT URINALYSIS DIP (DEVICE)
Bilirubin Urine: NEGATIVE
Glucose, UA: NEGATIVE mg/dL
Hgb urine dipstick: NEGATIVE
Leukocytes,Ua: NEGATIVE
Nitrite: NEGATIVE
Protein, ur: NEGATIVE mg/dL
Specific Gravity, Urine: 1.025 (ref 1.005–1.030)
Urobilinogen, UA: 0.2 mg/dL (ref 0.0–1.0)
pH: 6 (ref 5.0–8.0)

## 2019-02-03 LAB — POCT PREGNANCY, URINE: Preg Test, Ur: NEGATIVE

## 2019-02-03 NOTE — ED Provider Notes (Signed)
[facility]    CSN: [id number] Arrival date & time: 02/03/19  1003      History   Chief Complaint Chief Complaint  Patient presents with  . SEXUALLY TRANSMITTED DISEASE    HPI [name] is a 28 y.o. female female who comes to the urgent care requesting STD screening.  Patient reconsult with previous partner after he broke up a couple years ago.  She denied any abdominal pain or pelvic pain.  She has scant vaginal discharge which is not foul-smelling.  Patient says this is usual for her.  She denies any dyspareunia.   HPI  Past Medical History:  Diagnosis Date  . Anxiety   . Heartburn    TUMS PRN  . Hx of acute pyelonephritis   . Migraines     Patient Active Problem List   Diagnosis Date Noted  . S/P cesarean section 10/01/2017  . Candida vaginitis 06/06/2017    Past Surgical History:  Procedure Laterality Date  . APPENDECTOMY    . CESAREAN SECTION     X 2 [facility]  . CESAREAN SECTION WITH BILATERAL TUBAL LIGATION N/A 10/01/2017   Procedure: CESAREAN SECTION WITH BILATERAL TUBAL LIGATION;  Surgeon: Mitchel HonourMorris, Megan, DO;  Location: [facility];  Service: Obstetrics;  Laterality: N/A;  Repeat edc 3/6 NKDA Heather RNFA  . HYSTEROSCOPY N/A 05/09/2014   Procedure: HYSTEROSCOPY WITH REMOVAL OF IUD; POSSIBLE LAPAROSCOPY;  Surgeon: Mitchel HonourMegan Morris, DO;  Location: [facility];  Service: Gynecology;  Laterality: N/A;    OB History    Gravida  3   Para  3   Term  3   Preterm      AB      Living  3     SAB      TAB      Ectopic      Multiple  0   Live Births  3            Home Medications    Prior to Admission medications   Medication Sig Start Date End Date Taking? Authorizing Provider  calcium carbonate (TUMS - DOSED IN MG ELEMENTAL CALCIUM) 500 MG chewable tablet Chew 1,000 mg by mouth daily as needed for indigestion or heartburn.    [provider]  ferrous sulfate 325 (65 FE) MG EC tablet Take 325 mg by mouth daily.      [provider]  ibuprofen (ADVIL,MOTRIN) 600 MG tablet Take 1 tablet (600 mg total) by mouth every 6 (six) hours. 10/03/17   Zelphia CairoAdkins, Gretchen, MD  oxyCODONE-acetaminophen (PERCOCET/ROXICET) 5-325 MG tablet Take 1-2 tablets by mouth every 4 (four) hours as needed (pain scale 4-7). 10/03/17   Zelphia CairoAdkins, Gretchen, MD  Prenatal Vit-Fe Fumarate-FA (PRENATAL MULTIVITAMIN) TABS tablet Take 1 tablet by mouth daily at 12 noon.    [provider]  terconazole (TERAZOL 3) 0.8 % vaginal cream Place 1 applicator vaginally at bedtime. Patient not taking: Reported on 10/01/2017 08/30/17   Judeth HornLawrence, Erin, NP    Family History Family History  Problem Relation Age of Onset  . Heart attack Maternal Grandfather     Social History Social History   Tobacco Use  . Smoking status: Former Smoker    Packs/day: 0.25    Years: 4.00    Pack years: 1.00    Types: Cigarettes    Quit date: 08/05/2013    Years since quitting: 5.5  . Smokeless tobacco: Never Used  Substance Use Topics  . 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 Drug use: No    Types: Marijuana    Comment: LAST USE 04/16/14     Allergies   Patient has no known allergies.   Review of Systems Review of Systems  Constitutional: Negative.   HENT: Negative.   Gastrointestinal: Negative.   Musculoskeletal: Negative.   Skin: Negative.   Neurological: Negative.      Physical Exam Triage Vital Signs ED Triage Vitals  Enc Vitals Group     BP      Pulse      Resp      Temp      Temp src      SpO2      Weight      Height      Head Circumference      Peak Flow      Pain Score      Pain Loc      Pain Edu?      Excl. in [place]?    No data found.  Updated Vital Signs There were no vitals taken for this visit.  Visual Acuity Right Eye Distance:   Left Eye Distance:   Bilateral Distance:    Right Eye Near:   Left Eye Near:    Bilateral Near:     Physical Exam Constitutional:      General: She  is not in acute distress.    Appearance: Normal appearance. She is not ill-appearing.  Cardiovascular:     Rate and Rhythm: Normal rate and regular rhythm.  Pulmonary:     Effort: Pulmonary effort is normal.     Breath sounds: Normal breath sounds.  Abdominal:     General: Bowel sounds are normal.     Palpations: Abdomen is soft.  Musculoskeletal: Normal range of motion.  Skin:    General: Skin is warm.     Capillary Refill: Capillary refill takes less than 2 seconds.  Neurological:     General: No focal deficit present.     Mental Status: She is alert and oriented to person, place, and time.      UC Treatments / Results  Labs (all labs ordered are listed, but only abnormal results are displayed) Labs Reviewed - No data to display  EKG   Radiology No results found.  Procedures Procedures (including critical care time)  Medications Ordered in UC Medications - No data to display  Initial Impression / Assessment and Plan / UC Course  I have reviewed the triage vital signs and the nursing notes.  Pertinent labs & imaging results that were available during my care of the patient were reviewed by me and considered in my medical decision making (see chart for details).     1.  STD screen: Urine for GC/chlamydia/trichomonas/Gardnerella RPR/HIV Point-of-care urinalysis Urine pregnancy test was negative Final Clinical Impressions(s) / UC Diagnoses   Final diagnoses:  None   Discharge Instructions   None    ED Prescriptions    None     Controlled Substance Prescriptions [place] Controlled Substance Registry consulted? No   Chase Picket, MD 02/03/19 1124

## 2019-02-03 NOTE — ED Triage Notes (Signed)
Pt states she would like to be tested for STD's. Pt states she's not having any symptoms.

## 2019-02-04 LAB — RPR: RPR Ser Ql: NONREACTIVE

## 2019-02-04 LAB — URINE CYTOLOGY ANCILLARY ONLY
Chlamydia: NEGATIVE
Neisseria Gonorrhea: NEGATIVE
Trichomonas: NEGATIVE

## 2019-02-04 LAB — HIV ANTIBODY (ROUTINE TESTING W REFLEX): HIV Screen 4th Generation wRfx: NONREACTIVE

## 2019-02-07 ENCOUNTER — Telehealth (HOSPITAL_COMMUNITY): Payer: Self-pay | Admitting: *Deleted

## 2019-02-07 NOTE — Telephone Encounter (Signed)
Pt requesting test results from 02/03/19 visit.  After confirming 2 patient identifiers, pt notified of all negative test results.  Pt verbalized understanding.

## 2019-02-08 ENCOUNTER — Telehealth (HOSPITAL_COMMUNITY): Payer: Self-pay | Admitting: Emergency Medicine

## 2019-02-08 LAB — URINE CYTOLOGY ANCILLARY ONLY

## 2019-02-08 MED ORDER — METRONIDAZOLE 500 MG PO TABS: 500.0000 mg | ORAL_TABLET | Freq: Two times a day (BID) | ORAL | 0 refills | Status: AC

## 2019-02-08 NOTE — Telephone Encounter (Signed)
Bacterial vaginosis is positive. This was not treated at the urgent care visit.  Flagyl 500 mg BID x 7 days #14 no refills sent to patients pharmacy of choice.    Attempted to reach patient. No answer at this time. Call cannot be completed.   

## 2019-02-11 ENCOUNTER — Telehealth (HOSPITAL_COMMUNITY): Payer: Self-pay | Admitting: Emergency Medicine

## 2019-02-11 NOTE — Telephone Encounter (Signed)
Attempted to reach patient x2. No answer at this time. Call cannot be completed.   

## 2019-02-12 ENCOUNTER — Telehealth (HOSPITAL_COMMUNITY): Payer: Self-pay | Admitting: Emergency Medicine

## 2019-02-12 NOTE — Telephone Encounter (Signed)
Attempted to reach patient x3. No answer at this time. Call cannot be completed.   

## 2019-11-30 ENCOUNTER — Ambulatory Visit: Payer: Medicaid Other | Admitting: Family Medicine

## 2019-11-30 ENCOUNTER — Other Ambulatory Visit: Payer: Self-pay

## 2019-11-30 ENCOUNTER — Encounter: Payer: Self-pay | Admitting: Family Medicine

## 2019-11-30 VITALS — BP 90/60 | HR 71 | Ht 63.19 in | Wt 135.0 lb

## 2019-11-30 DIAGNOSIS — R4589 Other symptoms and signs involving emotional state: Secondary | ICD-10-CM

## 2019-11-30 DIAGNOSIS — Z7689 Persons encountering health services in other specified circumstances: Secondary | ICD-10-CM | POA: Insufficient documentation

## 2019-11-30 NOTE — Assessment & Plan Note (Signed)
New patient packet reviewed.  Patient's past medical, surgical, family, social history, medications reviewed with patient.  Most likely patient's last Pap smear was in 2018, denies abnormal Paps in the past.  Advised patient to come back in the next few months to have a Pap smear performed.  She is also going to contact her OB office to see when her last Tdap was.

## 2019-11-30 NOTE — Assessment & Plan Note (Signed)
PHQ-9 score of 6.  Denies SI.  Discussed with patient treatment options for her depression, as this seems to be bothering her.  Discussed therapy and medication.  Patient would like to hold off on medication at this time, and would like to pursue therapy.  Patient given therapy resources that should be covered by Medicaid.  Advised her to come back in 1 month to ensure that this is going well.  Also given suicide hotline number.

## 2019-11-30 NOTE — Patient Instructions (Signed)
Thank you for coming to see me today. It was a pleasure to meet you. Today we talked about:   Come back for a pap smear in a few months.  Please follow-up with me in 1 month.  If you have any questions or concerns, please do not hesitate to call the office at [phone].  Best,   Luis Abed, DO   Therapy and Counseling Resources Most providers on this list will take Medicaid. Patients with commercial insurance or Medicare should contact their insurance company to get a list of in network providers.  [facility]  [address], [address]   [place], [place] [address]      [phone]  [facility] [address]  [place], [place] [address]       [phone]     [facility] [address] [place], [place]  [phone]    [facility] [address], [place], [place] [phone]  [facility]:  [address] [place] [phone]  [facility]:  [address] [place], [place] [phone]  [facility] (under & uninsured) [address]   [place] [phone]    [email or web address]    [facility] [place] -[address]     Phone:  [phone]     [place] [place]-  [address]  [phone]   [facility] [address]      [place], [place] [phone]  [facility]: [address] [place], [place], [place]  [phone]   [facility] (Spanish therapist) [address]   [place] [address]    [phone]    [facility]   [address],  [place], [place]  [phone]   [facility]  [address] [place]  [phone]  [facility]  [address] [place], [place]        [phone]  [facility] under & uninsured [place], To schedule an  appointment call [phone]- [phone] [address] [place] [phone]):  [place] [place] - Fri from 8 AM - 3 PM  [facility] [address],  (Spanish)   [address] [place], [place]: [phone]) 8:30 - 12; 1 - 2:30  [facility] [employer],  [address] [place], [place]    ([phone]):8:30 - 12; 2 - 3PM  [facility],  [address]  [place]; [phone]):   Mon - Fri 8 AM - 5 PM  [facility] [address] [place]  MWF 12:30 to 3:00 or call to schedule an appointment  [phone]  Specific Provider options Psychology Today  [email or web address]/us 1. click on find a therapist  2. enter your zip code 3. left side and select or tailor a therapist for your specific need.   [facility] Provider Directory [email or web address]  (Medicaid)   Follow all drop down to find a provider  Social Support program [facility] [place] [phone] or [employer] [address], [place], [place] Recovery support and educational   In home counseling [facility] Telephone: [phone]  office in [place] [email or web address]   Does not take reg. Medicaid or Medicare private insurance [facility], [place] health Choice, [facility], [place], [place], [place], [place] Health Choice  24- Hour Availability:  . [facility]  [place] or [phone]  . [facility] [employer] [phone]  [facility]  [phone]   . [place]  [phone] (after hours)  . [facility]/Mobile Crisis   [phone]  . [place] National Suicide Hotline  (618)231-9041 ([place])  . Call 911 or go to emergency room  . [employer]  [phone]);  [place] and [employer]   . [facility]  [phone]); [place], [place], [place], [place], [place], [place], [place]

## 2019-11-30 NOTE — Progress Notes (Signed)
    SUBJECTIVE:   CHIEF COMPLAINT / HPI:   Establish Care  Patient presents today to establish care.  Prior OB was Dr. Langston Masker at [facility].  No prior PCP since being a child.  Thinks her last pap was in 2018.  Has never had an abnormal pap smear.  No known family history of breast cancer or ovarian cancer.  Depression Going through a divorce.  Was married 12 years and have 3 children together.  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  First time being on her own, was married at age 10.  Has a lot of stresses going on.  Stopped drinking and smoking.  Would drink for her emotions.  Has times when she just isn't happy, doesn't want to work.  Will cry on her way home.      Office Visit from 11/30/2019 in [facility]  PHQ-9 Total Score  6      PERTINENT  PMH / PSH: G3P3, hx BTL  OBJECTIVE:   BP 90/60   Pulse 71   Ht 5' 3.19" (1.605 m)   Wt 135 lb (61.2 kg)   LMP 11/21/2019   SpO2 99%   BMI 23.77 kg/m    Physical Exam:  General: 29 y.o. female in NAD Cardio: RRR no m/r/g Lungs: CTAB, no wheezing, no rhonchi, no crackles, no IWOB on RA Skin: warm and dry Extremities: No edema   ASSESSMENT/PLAN:   Encounter to establish care with new doctor New patient packet reviewed.  Patient's past medical, surgical, family, social history, medications reviewed with patient.  Most likely patient's last Pap smear was in 2018, denies abnormal Paps in the past.  Advised patient to come back in the next few months to have a Pap smear performed.  She is also going to contact her OB office to see when her last Tdap was.  Depressed mood PHQ-9 score of 6.  Denies SI.  Discussed with patient treatment options for her depression, as this seems to be bothering her.  Discussed therapy and medication.  Patient would like to hold off on medication at this time, and would like to pursue therapy.  Patient given therapy resources that should be covered by Medicaid.  Advised her to come  back in 1 month to ensure that this is going well.  Also given suicide hotline number.     Unknown Jim, DO [facility]

## 2020-01-04 ENCOUNTER — Ambulatory Visit: Payer: Medicaid Other | Admitting: Family Medicine

## 2020-01-10 ENCOUNTER — Other Ambulatory Visit: Payer: Self-pay

## 2020-01-10 ENCOUNTER — Encounter: Payer: Self-pay | Admitting: Family Medicine

## 2020-01-10 ENCOUNTER — Ambulatory Visit (INDEPENDENT_AMBULATORY_CARE_PROVIDER_SITE_OTHER): Payer: Medicaid Other | Admitting: Family Medicine

## 2020-01-10 VITALS — BP 99/60 | HR 77 | Temp 98.7°F | Wt 138.0 lb

## 2020-01-10 DIAGNOSIS — R4589 Other symptoms and signs involving emotional state: Secondary | ICD-10-CM | POA: Diagnosis not present

## 2020-01-10 DIAGNOSIS — Z789 Other specified health status: Secondary | ICD-10-CM | POA: Diagnosis not present

## 2020-01-10 NOTE — Patient Instructions (Signed)
Thank you for coming to see me today. It was a pleasure. Today we talked about:   You have been scheduled with Dr. [name] for therapy.  Stay on the waiting list at the other places.  If you have questions or concerns in the meantime, please let me know.  Make sure you eat a well-balanced diet and ensure you get enough Vitamin B12, folate, and iron from your diet.  Please follow-up with me in 2 months to ensure your mood is doing well.  If you have any questions or concerns, please do not hesitate to call the office at [phone].  Best,   Luis Abed, DO

## 2020-01-10 NOTE — Progress Notes (Signed)
    SUBJECTIVE:   CHIEF COMPLAINT / HPI:   Depressed mood PHQ-9 score 6 on 4/27 Referred to therapy at last visit as patient wanted to avoid medication She has a remote history of alcohol use and THC use, but has been clean for some time. She has spoken with multiple therapy groups, but has been placed on waiting list.  She thinks that she would benefit from therapy. She continues to want to avoid medication because of her history of addiction.    Office Visit from 01/10/2020 in [facility]  PHQ-9 Total Score  6      Vegan Diet Patient reports that she is planning on switching to a mostly plant-based diet and she was wondering if she needs to make any changes and if this is safe for her.  Needs a pap but declines today because she is on her period.  PERTINENT  PMH / PSH: History of postpartum depression  OBJECTIVE:   BP 99/60   Pulse 77   Temp 98.7 F (37.1 C) (Oral)   Wt 138 lb (62.6 kg)   LMP 12/16/2019   SpO2 99%   BMI 24.30 kg/m    Physical Exam:  General: 29 y.o. female in NAD Lungs: Breathing comfortably on room air Skin: warm and dry Extremities: No edema   ASSESSMENT/PLAN:   Depressed mood PHQ-9 score today remains 6.  Patient denies SI.  Has been working to get a therapist, but has not been able to find one.  Spoke with Dr. [name] who agrees to see patient in the meantime for therapy.  They scheduled an appointment for later this week.  She continues to decline medication at this time.  We will have her follow-up in 2 months.  Vegan diet Discussed with patient the need to eat a wide variety of foods and to really research what she is eating to ensure that she is getting adequate nutrients.  Advised her to focus mostly on vitamin B12, folate, iron to ensure that these are adequate within her diet.     Unknown Jim, DO [facility]

## 2020-01-10 NOTE — Assessment & Plan Note (Signed)
Discussed with patient the need to eat a wide variety of foods and to really research what she is eating to ensure that she is getting adequate nutrients.  Advised her to focus mostly on vitamin B12, folate, iron to ensure that these are adequate within her diet.

## 2020-01-10 NOTE — Assessment & Plan Note (Signed)
PHQ-9 score today remains 6.  Patient denies SI.  Has been working to get a therapist, but has not been able to find one.  Spoke with Dr. [name] who agrees to see patient in the meantime for therapy.  They scheduled an appointment for later this week.  She continues to decline medication at this time.  We will have her follow-up in 2 months.

## 2020-01-14 ENCOUNTER — Other Ambulatory Visit: Payer: Self-pay

## 2020-01-14 ENCOUNTER — Ambulatory Visit (INDEPENDENT_AMBULATORY_CARE_PROVIDER_SITE_OTHER): Payer: Medicaid Other | Admitting: Psychology

## 2020-01-14 DIAGNOSIS — R4589 Other symptoms and signs involving emotional state: Secondary | ICD-10-CM

## 2020-01-14 NOTE — BH Specialist Note (Signed)
Integrated Behavioral Health Visit   01/14/2020 JENNET SCROGGIN [id number]   Session Start time: 3  Session End time: 345 Total time: 45   Referring Provider: Dr. Obie Dredge Pt was told limits of confidentiality.   PRESENTING CONCERNS: Patient and/or family reports the following symptoms/concerns: Pt reported she experiences "episodes" of down mood where she feels overwhelmed. Pt shared changes in her life include recent divorce.  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  Pt and Dr. [name] discussed her coping strategies when she is feeling emotionally overwhelmed including taking a walk.  Pt was encouraged that she is doing lots of positive coping strategies.   Duration of problem: past few months; Severity of problem: mild  STRENGTHS (Protective Factors/Coping Skills): Children, boyfriend, coping skills in place  GOALS ADDRESSED: Patient will: 1.  Reduce symptoms of: depression: feelings of sadness and overwhelmed  2.  Increase knowledge and/or ability of: coping skills: pt engaged in walking/running to release emotion  3.  Demonstrate ability to: Increase healthy adjustment to current life circumstances  INTERVENTIONS: Interventions utilized:  Motivational Interviewing and supportive therapy  Standardized Assessments completed: PHQ 2  ASSESSMENT: Patient currently experiencing depressed mood related to current changes in life   Patient may benefit from CBT and continued supportive therapy   PLAN: 1. Follow up with behavioral health clinician on : coping strategies  2. Behavioral recommendations: CBT  3. Referral(s): [facility] (In Clinic)  Royetta Asal, PhD., LMFT-A

## 2020-01-24 ENCOUNTER — Ambulatory Visit (INDEPENDENT_AMBULATORY_CARE_PROVIDER_SITE_OTHER): Payer: Medicaid Other | Admitting: Psychology

## 2020-01-24 ENCOUNTER — Other Ambulatory Visit: Payer: Self-pay

## 2020-01-24 DIAGNOSIS — R4589 Other symptoms and signs involving emotional state: Secondary | ICD-10-CM

## 2020-01-24 NOTE — BH Specialist Note (Signed)
Integrated Behavioral Health Visit  01/24/2020 [name] [id number]   Session Start time: 3  Session End time: 345 Total time: 45   Referring Provider: Dr. Obie Dredge   PRESENTING CONCERNS: Patient and/or family reports the following symptoms/concerns: Pt reported she has had improved mood recently.  Pt and Dr. [name] discussed her expectations for herself and how they impact her relationships.  Pt shared a narrative she holds of herself.  Pt and Dr. [name] dicussed how this impacts her mood   Duration of problem: past few months; Severity of problem: mild  STRENGTHS (Protective Factors/Coping Skills): Children, boyfriend, Coping skills in place   GOALS ADDRESSED: Patient will: 1.  Reduce symptoms of: depressed mood: feelings of sadness and overwhelmed  2.  Increase knowledge and/or ability of: coping skills: pt engaged in walking/running to release emotion; self-reflection 3.  Demonstrate ability to: Increase healthy adjustment to current life circumstances  INTERVENTIONS: Interventions utilized:  CBT; narrative therapy; supportive therapy  Standardized Assessments completed: PHQ 2  ASSESSMENT: Patient currently experiencing depressed mood related to current changes in life   Patient may benefit from narrative therapy and CBT to challenge negative thoughts and continued supportive therapy   PLAN: 1. Follow up with behavioral health clinician on : coping strategies; self-reflection 2. Behavioral recommendations: CBT with narrative therapy  3. Referral(s): [facility] [employer] (In Clinic)  Royetta Asal, PhD., LMFT-A

## 2020-01-28 ENCOUNTER — Telehealth: Payer: Self-pay

## 2020-01-28 NOTE — Telephone Encounter (Signed)
Patient calls nurse line complaining of chest discomfort. 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 Patient does not feel it is anything "emergent" just wanted to "run it by someone." ED precautions given to patient. Continued "chest pain," SOB, left arm tingling, and/or jaw tightening. Patient verbalized.

## 2020-02-09 ENCOUNTER — Ambulatory Visit (INDEPENDENT_AMBULATORY_CARE_PROVIDER_SITE_OTHER): Payer: Medicaid Other | Admitting: Psychology

## 2020-02-09 ENCOUNTER — Other Ambulatory Visit: Payer: Self-pay

## 2020-02-09 DIAGNOSIS — R4589 Other symptoms and signs involving emotional state: Secondary | ICD-10-CM | POA: Diagnosis not present

## 2020-02-10 NOTE — BH Specialist Note (Signed)
Integrated Behavioral Health Visit  02/10/2020 MAGDALINA WHITEHEAD [id number]   Session Start time: 3  Session End time: 345 Total time: 45   Referring Provider: Dr. Obie Dredge     PRESENTING CONCERNS: Patient and/or family reports the following symptoms/concerns: Pt reported she had a stressful week this past week where she lost a family member and some other stressors.  Pt reported that she became "numb" when she got overwhelmed.  Pt and Dr. [name] discussed emotions around und what numb could be.  Duration of problem: past few months; Severity of problem: mild  STRENGTHS (Protective Factors/Coping Skills): Children, boyfriend, Coping skills in place   GOALS ADDRESSED: Patient will: 1. Reduce symptoms of: depressed mood: feelings of sadness and overwhelmed 2. Increase knowledge and/or ability of: coping skills: pt engaged in walking/running to release emotion; self-reflection 3. Demonstrate ability to: Increase healthy adjustment to current life circumstances  INTERVENTIONS: Interventions utilized:CBT; narrative therapy; supportive therapy Standardized Assessments completed:none  ASSESSMENT: Patient currently experiencingdepressed mood related to current changes in life  Patient may benefit fromnarrative therapy and CBT to challenge negative thoughts and continued supportive therapy  PLAN: 1. Follow up with behavioral health clinician on :coping strategies; self-reflection 2. Behavioral recommendations:CBT with narrative therapy  3. Referral(s):[facility] [employer] (In Clinic)  Royetta Asal, PhD., LMFT-A

## 2020-02-24 ENCOUNTER — Ambulatory Visit: Payer: Medicaid Other | Admitting: Psychology

## 2020-02-25 ENCOUNTER — Other Ambulatory Visit: Payer: Self-pay

## 2020-02-25 ENCOUNTER — Ambulatory Visit (INDEPENDENT_AMBULATORY_CARE_PROVIDER_SITE_OTHER): Payer: Medicaid Other | Admitting: Psychology

## 2020-02-25 DIAGNOSIS — R4589 Other symptoms and signs involving emotional state: Secondary | ICD-10-CM | POA: Diagnosis not present

## 2020-02-25 NOTE — BH Specialist Note (Signed)
Integrated Behavioral Health Visit   02/25/2020 MIIA BLANKS [id number]   Session Start time: 2  Session End time: 230 Total time: 30  Referring Provider: Dr. Obie Dredge    PRESENTING CONCERNS: Patient and/or family reports the following symptoms/concerns:Pt reported she and her boyfriend had discussions about their future.  Pt and Dr. [name] reflected on how this relationship is different than her past marriage and what that means to her.  Pt shared her childhood experiences growing up with her mom and how she has changed her parenting because of it.   Pt reflective on how she has changed her narrative.  Duration of problem:past few months; Severity of problem:mild  STRENGTHS (Protective Factors/Coping Skills): Children, boyfriend,Coping skills in place  GOALS ADDRESSED: Patient will: 1. Reduce symptoms DH:DIXBOERQS mood: feelings of sadness and overwhelmedhave decreased 2. Increase knowledge and/or ability of: coping skills: pt engaged in walking/running to release emotion; self-reflection 3. Demonstrate ability to: Increase healthy adjustment to current life circumstances  INTERVENTIONS: Interventions utilized:CBT;narrative therapy;supportive therapy Standardized Assessments completed:none  ASSESSMENT: Patient currently experiencingdepressed mood related to current changes in life  Patient may benefit fromnarrative therapy and CBT to challenge negative thoughtsand continued supportive therapy  PLAN: 1. Follow up with behavioral health clinician on :coping strategies; self-reflection 2. Behavioral recommendations:CBTwithnarrative therapy; pt feels syx are improving and would like to take more time between appts 3. Referral(s):[facility] [employer] (In Clinic)  Royetta Asal, PhD., LMFT-A

## 2020-03-01 ENCOUNTER — Ambulatory Visit (INDEPENDENT_AMBULATORY_CARE_PROVIDER_SITE_OTHER): Payer: Medicaid Other | Admitting: Family Medicine

## 2020-03-01 ENCOUNTER — Other Ambulatory Visit: Payer: Self-pay

## 2020-03-01 ENCOUNTER — Encounter: Payer: Self-pay | Admitting: Family Medicine

## 2020-03-01 DIAGNOSIS — Z789 Other specified health status: Secondary | ICD-10-CM | POA: Diagnosis not present

## 2020-03-01 DIAGNOSIS — R21 Rash and other nonspecific skin eruption: Secondary | ICD-10-CM | POA: Diagnosis not present

## 2020-03-01 NOTE — Assessment & Plan Note (Addendum)
Likely Atopic Dermatitis.  Patient indicates pruritis and rash resolve with Aveeno moisturizing cream. - Continue moisturizing cream.  Discussed with patient possibility of adding on low dose steroid if she wants, but patient fine continuing with just Aveeno cream - F/u if rash becomes worse or not resolving with moisturizing cream

## 2020-03-01 NOTE — Assessment & Plan Note (Addendum)
Patient indicates she does have occasional meat and dairy.  No symptoms or concerns for peripheral neuropathy or anemia at this time. - Would recommend reccomending B-Complex supplement at future visit with PCPas patient not currently taking any

## 2020-03-01 NOTE — Patient Instructions (Signed)
It was good to see you today.  Thank you for coming in.  I think you have Eczema  Continue to use Aveeno moisturizing cream.  Come back to see if rash gets significantly worse.  I also recommend a B-Complex multivitamin which you can get over the counter to supplement Vegan Diet.  Be Well, Dr Drue Flirt

## 2020-03-01 NOTE — Progress Notes (Signed)
    SUBJECTIVE:   CHIEF COMPLAINT / HPI: Covid Vaccine 1st Dose  [name] is a 29 yo female here for first dose of COVID-19 vaccine.  Also complains of rash.  Indicates has had rash on hips and sides of body intermittnetly for over 6 years now.  Indicates it is usually itchy and sometimes painful.  Denies any new muscle or joint pain, fevers or n/v/d.  Indicates her bowel movements been normal and denies any hematemesis or melena.  Indicates rash usually resolves with Aveeno Moisturizing cream.  Is not overly bothersome for patient.  Patient also follows Vegan Diet.  Indicates she occasionally "cheats" on diet with occasional meat and dairy products.  Denies any numbness oir tingling in hands or feet.  Denies any weakness, fatigue or lightheadedness.  PERTINENT  PMH / PSH: N/A  OBJECTIVE:   BP (!) 98/54   Pulse 93   Ht 5\' 2"  (1.575 m)   Wt 139 lb 9.6 oz (63.3 kg)   SpO2 99%   BMI 25.53 kg/m    Physical Exam Constitutional:      General: She is not in acute distress.    Appearance: Normal appearance. She is not ill-appearing.  Cardiovascular:     Rate and Rhythm: Normal rate and regular rhythm.  Pulmonary:     Effort: Pulmonary effort is normal.     Breath sounds: Normal breath sounds.  Skin:    General: Skin is warm.     Findings: Rash present.     Comments: Mildly erythematous papular rash on left hip and side  Neurological:     Mental Status: She is alert.     ASSESSMENT/PLAN:   Patient left before receiving AVS.  Obtained 1st COVID shot today.  Rash Likely Atopic Dermatitis.  Patient indicates pruritis and rash resolve with Aveeno moisturizing cream. - Continue moisturizing cream.  Discussed with patient possibility of adding on low dose steroid if she wants, but patient fine continuing with just Aveeno cream - F/u if rash becomes worse or not resolving with moisturizing cream  Vegan diet Patient indicates she does have occasional meat and dairy.  No  symptoms or concerns for peripheral neuropathy or anemia at this time. - Would recommend reccomending B-Complex supplement at future visit with PCPas patient not currently taking any     , MD [facility]

## 2020-03-15 ENCOUNTER — Other Ambulatory Visit: Payer: Self-pay

## 2020-03-15 ENCOUNTER — Ambulatory Visit (INDEPENDENT_AMBULATORY_CARE_PROVIDER_SITE_OTHER): Payer: Medicaid Other | Admitting: Psychology

## 2020-03-15 NOTE — BH Specialist Note (Signed)
Pt had family emergency and was not able to complete visit.

## 2020-03-22 ENCOUNTER — Ambulatory Visit (INDEPENDENT_AMBULATORY_CARE_PROVIDER_SITE_OTHER): Payer: Medicaid Other

## 2020-03-22 ENCOUNTER — Other Ambulatory Visit: Payer: Self-pay

## 2020-03-22 DIAGNOSIS — Z23 Encounter for immunization: Secondary | ICD-10-CM

## 2020-03-22 NOTE — Progress Notes (Signed)
   Covid-19 Vaccination Clinic  Name:  SANIYA TRANCHINA    MRN: [id number] DOB: July 23, 1991  03/22/2020  Ms. Vanecek was observed post Covid-19 immunization for 15 minutes without incident. She was provided with Vaccine Information Sheet and instruction to access the V-Safe system.   Ms. Mcgaughey was instructed to call 911 with any severe reactions post vaccine: Marland Kitchen Difficulty breathing  . Swelling of face and throat  . A fast heartbeat  . A bad rash all over body  . Dizziness and weakness   #2 Covid Vaccine administered RD without complications.

## 2020-03-23 ENCOUNTER — Other Ambulatory Visit: Payer: Self-pay

## 2020-03-23 ENCOUNTER — Ambulatory Visit (INDEPENDENT_AMBULATORY_CARE_PROVIDER_SITE_OTHER): Payer: Medicaid Other | Admitting: Psychology

## 2020-03-23 DIAGNOSIS — R4589 Other symptoms and signs involving emotional state: Secondary | ICD-10-CM

## 2020-03-23 NOTE — Progress Notes (Addendum)
  Integrated Behavioral Health Visit   03/23/2020 YAMELI DELAMATER [id number]   Session Start time: 2  Session End time: 230 Total time: 30  Referring Provider: Dr. Obie Dredge  Confidentiality has been reviewed with patient.  Patient verbalized consent for a integrated care visit.  Pt verbally consented to Dr. Larita Fife shadowing this appointment.   PRESENTING CONCERNS: Patient and/or family reports the following symptoms/concerns: Pt reported a string of stressful events that happened to her this past couple of weeks.  Pt reported her mother mental health has not been stable.  Pt reported she has been struggling with her dad.  Pt reported she has been caring for her boyfriend who just lost his brother. Pt and Dr. [name] discussed her being put in the parent role with her parents.  Pt and Dr. [name] engaged in conversation about how she feels "numb" and maybe she is in "survival" mode due to all the events.  Pt and Dr. [name] discussed ways to decompress and create empathy for herself.   Pt was given crisis info for her mother.  Duration of problem: past few months; Severity of problem: mild   STRENGTHS (Protective Factors/Coping Skills): Children, boyfriend,Coping skills in place  GOALS ADDRESSED: Patient will: 1. Reduce symptoms [id number] mood: feeling of  overwhelmedhave caused her lack of emotion (numbness) changed from being emotionally overwhelmed 2. Increase knowledge and/or ability of: coping skills: pt engaged in walking/running to release emotion; self-reflection; decompress activities  3. Demonstrate ability to: Increase healthy adjustment to current life circumstances  INTERVENTIONS: Interventions utilized:CBT;narrative therapy;supportive therapy Standardized Assessments completed:none  ASSESSMENT: Patient currently experiencingdepressed mood related to current changes in life  Patient may benefit fromnarrative therapy and CBT to challenge negative  thoughtsand continued supportive therapy  PLAN: 1. Follow up with behavioral health clinician on :coping strategies; self-reflection 2. Behavioral recommendations:CBTwithnarrative therapy; pt feels syx are improving and would like to take more time between appts 3. Referral(s):[facility] [employer] (In Clinic)  Royetta Asal, PhD., LMFT-A

## 2020-03-24 ENCOUNTER — Other Ambulatory Visit: Payer: Self-pay

## 2020-03-24 ENCOUNTER — Other Ambulatory Visit (HOSPITAL_COMMUNITY)
Admission: RE | Admit: 2020-03-24 | Discharge: 2020-03-24 | Disposition: A | Discharge status: Home / Self Care | Payer: Medicaid Other | Source: Ambulatory Visit | Attending: Family Medicine | Admitting: Family Medicine

## 2020-03-24 ENCOUNTER — Encounter: Payer: Self-pay | Admitting: Family Medicine

## 2020-03-24 ENCOUNTER — Ambulatory Visit (INDEPENDENT_AMBULATORY_CARE_PROVIDER_SITE_OTHER): Payer: Medicaid Other | Admitting: Family Medicine

## 2020-03-24 VITALS — BP 98/50 | HR 66 | Ht 62.0 in | Wt 140.0 lb

## 2020-03-24 DIAGNOSIS — R8290 Unspecified abnormal findings in urine: Secondary | ICD-10-CM | POA: Insufficient documentation

## 2020-03-24 DIAGNOSIS — Z113 Encounter for screening for infections with a predominantly sexual mode of transmission: Secondary | ICD-10-CM

## 2020-03-24 DIAGNOSIS — Z124 Encounter for screening for malignant neoplasm of cervix: Secondary | ICD-10-CM | POA: Insufficient documentation

## 2020-03-24 DIAGNOSIS — B9689 Other specified bacterial agents as the cause of diseases classified elsewhere: Secondary | ICD-10-CM | POA: Diagnosis not present

## 2020-03-24 DIAGNOSIS — N760 Acute vaginitis: Secondary | ICD-10-CM | POA: Diagnosis not present

## 2020-03-24 LAB — POCT WET PREP (WET MOUNT)
Clue Cells Wet Prep Whiff POC: POSITIVE
Trichomonas Wet Prep HPF POC: ABSENT

## 2020-03-24 LAB — POCT URINALYSIS DIP (MANUAL ENTRY)
Bilirubin, UA: NEGATIVE
Blood, UA: NEGATIVE
Glucose, UA: NEGATIVE mg/dL
Ketones, POC UA: NEGATIVE mg/dL
Leukocytes, UA: NEGATIVE
Nitrite, UA: NEGATIVE
Protein Ur, POC: NEGATIVE mg/dL
Spec Grav, UA: 1.015 (ref 1.010–1.025)
Urobilinogen, UA: 0.2 E.U./dL
pH, UA: 7 (ref 5.0–8.0)

## 2020-03-24 MED ORDER — METRONIDAZOLE 500 MG PO TABS: 500.0000 mg | ORAL_TABLET | Freq: Two times a day (BID) | ORAL | 0 refills | Status: AC

## 2020-03-24 NOTE — Assessment & Plan Note (Signed)
Pap test performed today with HPV co-testing.  Patient informed that if negative with negative HPV, good for 5 years.

## 2020-03-24 NOTE — Assessment & Plan Note (Addendum)
Throat and vaginal swab for gonorrhea and chlamydia performed.  Wet prep showed bacterial vaginosis with clue cells, bacteria, positive whiff.  Also noted few yeast and KOH, but patient's exam more consistent with bacterial vaginosis, therefore will treat for this.  HIV and RPR also ordered.

## 2020-03-24 NOTE — Assessment & Plan Note (Signed)
Wet prep and examination consistent with bacterial vaginosis.  Metronidazole 500 mg twice daily x7 days ordered.  Advised to not drink alcohol while taking this medication and to avoid intercourse until fully treated.  Return to care if no improvement.

## 2020-03-24 NOTE — Assessment & Plan Note (Addendum)
Patient reported a metallic swelling urine over a month ago, states feeling urine in the last week.  UA negative.  Since symptoms are now resolved, most likely secondary to something that the patient ate.  Come back if returns.

## 2020-03-24 NOTE — Progress Notes (Signed)
SUBJECTIVE:   CHIEF COMPLAINT / HPI:   Pap Smear Last Pap smear around 2018.  Prior paps: normal per patient report Patient's last menstrual period was 03/08/2020. Contraception: BTL  Sexually Active: yes, in monogamous relationship for over a year Desire for STD Screening: yes, desires throat swab as well and HIV and RPR  Self breast exams: yes  Concerns: metallic smell a few months ago and sweet smell to urine in the last week    PERTINENT  PMH / PSH: Depression  OBJECTIVE:   BP (!) 98/50    Pulse 66    Ht 5\' 2"  (1.575 m)    Wt 140 lb (63.5 kg)    LMP 03/08/2020    SpO2 99%    BMI 25.61 kg/m    Physical Exam:  General: 29 y.o. female in NAD Lungs: Breathing comfortably on RA Skin: warm and dry Extremities: No edema GU: Pelvic exam performed with patient supine.  Chaperone in room.  Bilateral labia without abnormalities, no inguinal LAD palpated.  Cervix exhibits white discharge, no cervix abnormalities.  No vaginal lesions.  Vaginal discharge white, watery.  Results for orders placed or performed in visit on 03/24/20 (from the past 24 hour(s))  POCT Wet Prep 03/26/20 [place])     Status: Abnormal   Collection Time: 03/24/20 10:45 AM  Result Value Ref Range   Source Wet Prep POC VAG    WBC, Wet Prep HPF POC 0-3    Bacteria Wet Prep HPF POC Many (A) Few   Clue Cells Wet Prep HPF POC Many (A) None   Clue Cells Wet Prep Whiff POC Positive Whiff    Yeast Wet Prep HPF POC Few (A) None   KOH Wet Prep POC Few (A) None   Trichomonas Wet Prep HPF POC Absent Absent  POCT urinalysis dipstick     Status: Abnormal   Collection Time: 03/24/20 11:10 AM  Result Value Ref Range   Color, UA yellow yellow   Clarity, UA hazy (A) clear   Glucose, UA negative negative mg/dL   Bilirubin, UA negative negative   Ketones, POC UA negative negative mg/dL   Spec Grav, UA 03/26/20 1.027 - 1.025   Blood, UA negative negative   pH, UA 7.0 5.0 - 8.0   Protein Ur, POC negative negative mg/dL    Urobilinogen, UA 0.2 0.2 or 1.0 E.U./dL   Nitrite, UA Negative Negative   Leukocytes, UA Negative Negative    ASSESSMENT/PLAN:   Screening for cervical cancer Pap test performed today with HPV co-testing.  Patient informed that if negative with negative HPV, good for 5 years.  Screen for STD (sexually transmitted disease) Throat and vaginal swab for gonorrhea and chlamydia performed.  Wet prep showed bacterial vaginosis with clue cells, bacteria, positive whiff.  Also noted few yeast and KOH, but patient's exam more consistent with bacterial vaginosis, therefore will treat for this.  HIV and RPR also ordered.  Foul smelling urine Patient reported a metallic swelling urine over a month ago, states feeling urine in the last week.  UA negative.  Since symptoms are now resolved, most likely secondary to something that the patient ate.  Come back if returns.  Bacterial vaginosis Wet prep and examination consistent with bacterial vaginosis.  Metronidazole 500 mg twice daily x7 days ordered.  Advised to not drink alcohol while taking this medication and to avoid intercourse until fully treated.  Return to care if no improvement.     2.536 Eyal Greenhaw, DO  [facility]

## 2020-03-24 NOTE — Patient Instructions (Addendum)
Thank you for coming to see me today. It was a pleasure. Today we talked about:   You have bacterial vaginosis, which is a bacterial imbalance in your vagina.  Take metronidazole twice daily for the next 7 days.  Avoid intercourse while being treated for best results.  Do not drink alcohol while taking this medication as it can make you very sick.  We performed a Pap smear today.  If it is negative with a negative HPV, this will be good for 5 years.  We will send you a letter if it is negative, if we need to do something about it we will give you a call.  Your gonorrhea and Chlamydia testing as well as your HIV and syphilis testing will likely come back on Monday.  We will send you a message on my chart if this is normal, otherwise we will call.  If you do not hear from [place] by Wednesday of next week, please call.  Your urine looks fine!  Please follow-up with me in 1-2 months for f/u of your mood.  If you have any questions or concerns, please do not hesitate to call the office at [phone].  Best,   Luis Abed, DO

## 2020-03-25 LAB — RPR: RPR Ser Ql: NONREACTIVE

## 2020-03-25 LAB — HIV ANTIBODY (ROUTINE TESTING W REFLEX): HIV Screen 4th Generation wRfx: NONREACTIVE

## 2020-03-27 LAB — CERVICOVAGINAL ANCILLARY ONLY
Chlamydia: NEGATIVE
Chlamydia: NEGATIVE
Comment: NEGATIVE
Comment: NEGATIVE
Comment: NORMAL
Comment: NORMAL
Neisseria Gonorrhea: NEGATIVE
Neisseria Gonorrhea: NEGATIVE

## 2020-03-27 LAB — CYTOLOGY - PAP
Comment: NEGATIVE
Diagnosis: NEGATIVE
High risk HPV: NEGATIVE

## 2020-04-11 DIAGNOSIS — Z20822 Contact with and (suspected) exposure to covid-19: Secondary | ICD-10-CM | POA: Diagnosis not present

## 2020-04-11 DIAGNOSIS — Z03818 Encounter for observation for suspected exposure to other biological agents ruled out: Secondary | ICD-10-CM | POA: Diagnosis not present

## 2020-04-17 DIAGNOSIS — Z20822 Contact with and (suspected) exposure to covid-19: Secondary | ICD-10-CM | POA: Diagnosis not present

## 2020-04-20 ENCOUNTER — Ambulatory Visit (INDEPENDENT_AMBULATORY_CARE_PROVIDER_SITE_OTHER): Payer: Medicaid Other | Admitting: Psychology

## 2020-04-20 ENCOUNTER — Other Ambulatory Visit: Payer: Self-pay

## 2020-04-20 DIAGNOSIS — R4589 Other symptoms and signs involving emotional state: Secondary | ICD-10-CM

## 2020-04-20 NOTE — BH Specialist Note (Signed)
Integrated Behavioral Health Visit   04/20/2020 [name] [id number]   Session Start time: 11  Session End time: 1130 Total time: 30  Referring Provider: Dr. Obie Dredge  Confidentiality has been reviewed with patient.  Patient verbalized consent for a integrated care visit.   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  Pt reported she has come off the wait list for longer term therapy and has begun that transition.   Pt and Dr. [name] discussed what she might work on moving forward with new therapist including  Duration of problem: past few months; Severity of problem: mild  STRENGTHS (Protective Factors/Coping Skills): Children, boyfriend,Coping skills in place  GOALS ADDRESSED: Patient will: 1. Reduce symptoms RC:BULAGTXMI mood: feeling of  overwhelmedhave caused her lack of emotion (numbness) changed from being emotionally overwhelmed 2. Increase knowledge and/or ability of: coping skills: pt engaged in walking/running to release emotion; self-reflection; decompress activities  3. Demonstrate ability to: Increase healthy adjustment to current life circumstances  INTERVENTIONS: Interventions utilized:CBT;narrative therapy;supportive therapy Standardized Assessments completed:none  ASSESSMENT: Patient currently experiencingdepressed mood related to current changes in life  Patient may benefit fromnarrative therapy and CBT to challenge negative thoughtsand continued supportive therapy  Patient and Dr. [name] reviewed and verbally consented to plan.  PLAN: 1. Follow up with behavioral health clinician on : n/a 2. Behavioral recommendations: continued CBT and narrative therapy 3. Referral(s): Paramedic (LME/Outside Clinic)  Royetta Asal, PhD., LMFT-A

## 2020-05-17 ENCOUNTER — Encounter: Payer: Self-pay | Admitting: Family Medicine

## 2020-05-17 ENCOUNTER — Ambulatory Visit (INDEPENDENT_AMBULATORY_CARE_PROVIDER_SITE_OTHER): Payer: Medicaid Other | Admitting: Family Medicine

## 2020-05-17 ENCOUNTER — Other Ambulatory Visit: Payer: Self-pay

## 2020-05-17 DIAGNOSIS — M5431 Sciatica, right side: Secondary | ICD-10-CM

## 2020-05-17 NOTE — Patient Instructions (Signed)
Good to see you today!  Thanks for coming in.  For the leg pain - I think this is sciatic nerve irritation - keep active - tylenol or ibuprofen if bad but mainly it position change  If any weakness or swelling or severe pain or loss of bowel bladder control then come right back or if not much better in 4-6 weeks  I would see the eye doctor for the black spot

## 2020-05-17 NOTE — Assessment & Plan Note (Signed)
Consistent with sciatic nerve irritation.  No signs of nerve compromise or dvt or infection. Not interested in medications.  See after visit summary for instructions

## 2020-05-17 NOTE — Progress Notes (Signed)
    SUBJECTIVE:   CHIEF COMPLAINT / HPI:   R LEG PAIN Started subacutely about a week ago.  Now intermittent pain starting at lower R buttock with radiation down to calf.  No weakness or incontinence or swelling or rash.  Not sever and had not taken any medication and not changed her normal acitivity  BLACK SPOT IN L EYE For about a month.  Not changing.  Worse in sunlight.  No pain or visual change or discharge   PERTINENT  PMH / PSH: no medications   OBJECTIVE:   BP 99/65   Pulse 66   Ht 5\' 2"  (1.575 m)   Wt 137 lb 6.4 oz (62.3 kg)   LMP 04/16/2020 (Approximate)   SpO2 100%   BMI 25.13 kg/m   Neurologic exam : Able to walk on heels and toes.   Able to do deep knee bend and touch toes Mildly tender over sciatic notch on R. No hip range of motion pain or any SLR pain  Eye - Pupils Equal Round Reactive to light, Extraocular movements intact, Fundi without hemorrhage or visible lesions, Conjunctiva without redness or discharge   ASSESSMENT/PLAN:   Sciatica, right side Consistent with sciatic nerve irritation.  No signs of nerve compromise or dvt or infection. Not interested in medications.  See after visit summary for instructions    BLACK SPOT IN VISION - normal exam. Perhaps early cataract.  No signs of trauma or infection  Suggested follow up with eye doctor.  List given   06/16/2020, MD [facility]

## 2020-08-29 ENCOUNTER — Ambulatory Visit (INDEPENDENT_AMBULATORY_CARE_PROVIDER_SITE_OTHER): Payer: Medicaid Other | Admitting: Family Medicine

## 2020-08-29 ENCOUNTER — Other Ambulatory Visit (HOSPITAL_COMMUNITY)
Admission: RE | Admit: 2020-08-29 | Discharge: 2020-08-29 | Disposition: A | Discharge status: Home / Self Care | Payer: Medicaid Other | Source: Ambulatory Visit | Attending: Family Medicine | Admitting: Family Medicine

## 2020-08-29 ENCOUNTER — Other Ambulatory Visit: Payer: Self-pay

## 2020-08-29 VITALS — BP 124/80 | HR 98 | Ht 62.0 in | Wt 137.8 lb

## 2020-08-29 DIAGNOSIS — B9689 Other specified bacterial agents as the cause of diseases classified elsewhere: Secondary | ICD-10-CM | POA: Diagnosis not present

## 2020-08-29 DIAGNOSIS — Z113 Encounter for screening for infections with a predominantly sexual mode of transmission: Secondary | ICD-10-CM | POA: Diagnosis present

## 2020-08-29 DIAGNOSIS — B3731 Acute candidiasis of vulva and vagina: Secondary | ICD-10-CM

## 2020-08-29 DIAGNOSIS — B373 Candidiasis of vulva and vagina: Secondary | ICD-10-CM

## 2020-08-29 DIAGNOSIS — N760 Acute vaginitis: Secondary | ICD-10-CM

## 2020-08-29 LAB — POCT WET PREP (WET MOUNT)
Clue Cells Wet Prep Whiff POC: POSITIVE
Trichomonas Wet Prep HPF POC: ABSENT

## 2020-08-29 MED ORDER — METRONIDAZOLE 500 MG PO TABS: 500.0000 mg | ORAL_TABLET | Freq: Two times a day (BID) | ORAL | 0 refills | Status: AC

## 2020-08-29 MED ORDER — FLUCONAZOLE 150 MG PO TABS: ORAL_TABLET | ORAL | 0 refills | Status: AC

## 2020-08-29 NOTE — Progress Notes (Signed)
   Subjective:   Patient ID: [name]    DOB: 12-09-1990, 30 y.o. female   MRN: [id number]  [name] is a 30 y.o. female with a history of sciatica, BV, depressed mood, vegan diet here for STD testing  STD testing: Patient notes that she was in a short relationship with another female partner and has since returned to her long term partner. Her long term partner also had sexual relationships with another female in the interim. She desires complete STD testing. LMP 07/17/20. Does not use condoms. Has had two female sexual partners in the last year. Tubal ligation for contraception. Denies any urinary symptoms, vaginal discharge, pelvic pain, fevers or chills.  Review of Systems:  Per HPI.   Objective:   BP 124/80   Pulse 98   Ht 5\' 2"  (1.575 m)   Wt 137 lb 12.8 oz (62.5 kg)   SpO2 99%   BMI 25.20 kg/m  Vitals and nursing note reviewed.  General: pleasant young female, sitting comfortably on exambed, well nourished, well developed, in no acute distress with non-toxic appearance HEENT: normocephalic, atraumatic, moist mucous membranes, oropharynx clear without erythema or exudate Resp: breathing comfortably on room air, speaking in full sentences Skin: warm, dry Extremities: warm and well perfused MSK: gait normal Neuro: Alert and oriented, speech normal Pelvic exam: VULVA: normal appearing vulva with no masses, tenderness or lesions, VAGINA: normal appearing vagina with normal color and discharge, no lesions, CERVIX: normal appearing cervix without lesions, cervical discharge present - creamy, RECTAL: normal rectal, no masses, exam chaperoned by .  Assessment & Plan:   Screen for STD (sexually transmitted disease) Asymptomatic. Rectal, oral, and vaginal GC/CL collected per patient request. Wet prep notable for bacterial vaginosis and yeast. HIV/RPR/Hep C pending. Tubal ligation for contraception. - follow up remaining labs, treat if indicated - Metronidazole  500mg  BID x 7 days. Avoid alcohol during use. - Diflucan 150mg  x 2 doses - Recommended condom use for protection of STD's. Consider PrEP therapy in future if STD's are of concern for patient.  Orders Placed This Encounter  Procedures  . HIV Antibody (routine testing w rflx)    Standing Status:   Future    Standing Expiration Date:   08/29/2021  . RPR    Standing Status:   Future    Standing Expiration Date:   08/29/2021  . Hepatitis C antibody    Standing Status:   Future    Standing Expiration Date:   08/29/2021  . POCT Wet Prep [facility])   Meds ordered this encounter  Medications  . metroNIDAZOLE (FLAGYL) 500 MG tablet    Sig: Take 1 tablet (500 mg total) by mouth 2 (two) times daily for 7 days.    Dispense:  14 tablet    Refill:  0  . fluconazole (DIFLUCAN) 150 MG tablet    Sig: Take 1 tablet now, then 1 tablet in 72 hours    Dispense:  2 tablet    Refill:  0    08/31/2021, DO PGY-3, [facility] 08/29/2020 5:20 PM

## 2020-08-29 NOTE — Assessment & Plan Note (Signed)
Asymptomatic. Rectal, oral, and vaginal GC/CL collected per patient request. Wet prep notable for bacterial vaginosis and yeast. HIV/RPR/Hep C pending. Tubal ligation for contraception. - follow up remaining labs, treat if indicated - Metronidazole 500mg  BID x 7 days. Avoid alcohol during use. - Diflucan 150mg  x 2 doses - Recommended condom use for protection of STD's. Consider PrEP therapy in future if STD's are of concern for patient.

## 2020-08-30 ENCOUNTER — Other Ambulatory Visit: Payer: Medicaid Other

## 2020-08-31 ENCOUNTER — Other Ambulatory Visit: Payer: Medicaid Other

## 2020-08-31 ENCOUNTER — Other Ambulatory Visit: Payer: Self-pay

## 2020-08-31 ENCOUNTER — Encounter: Payer: Self-pay | Admitting: Family Medicine

## 2020-08-31 DIAGNOSIS — Z113 Encounter for screening for infections with a predominantly sexual mode of transmission: Secondary | ICD-10-CM | POA: Diagnosis not present

## 2020-08-31 LAB — CERVICOVAGINAL ANCILLARY ONLY
Chlamydia: NEGATIVE
Chlamydia: NEGATIVE
Chlamydia: NEGATIVE
Comment: NEGATIVE
Comment: NEGATIVE
Comment: NEGATIVE
Comment: NEGATIVE
Comment: NEGATIVE
Comment: NEGATIVE
Comment: NORMAL
Comment: NORMAL
Comment: NORMAL
Neisseria Gonorrhea: NEGATIVE
Neisseria Gonorrhea: NEGATIVE
Neisseria Gonorrhea: NEGATIVE
Trichomonas: NEGATIVE
Trichomonas: NEGATIVE
Trichomonas: NEGATIVE

## 2020-09-01 LAB — RPR: RPR Ser Ql: NONREACTIVE

## 2020-09-01 LAB — HIV ANTIBODY (ROUTINE TESTING W REFLEX): HIV Screen 4th Generation wRfx: NONREACTIVE

## 2020-09-01 LAB — HEPATITIS C ANTIBODY: Hep C Virus Ab: <0.1 s/co ratio (ref 0.0–0.9)

## 2020-09-25 ENCOUNTER — Ambulatory Visit (INDEPENDENT_AMBULATORY_CARE_PROVIDER_SITE_OTHER): Payer: Medicaid Other

## 2020-09-25 ENCOUNTER — Other Ambulatory Visit: Payer: Self-pay

## 2020-09-25 DIAGNOSIS — Z23 Encounter for immunization: Secondary | ICD-10-CM

## 2020-11-03 DIAGNOSIS — Z419 Encounter for procedure for purposes other than remedying health state, unspecified: Secondary | ICD-10-CM | POA: Diagnosis not present

## 2020-12-03 DIAGNOSIS — Z419 Encounter for procedure for purposes other than remedying health state, unspecified: Secondary | ICD-10-CM | POA: Diagnosis not present

## 2020-12-04 ENCOUNTER — Ambulatory Visit: Payer: Medicaid Other

## 2020-12-04 NOTE — Progress Notes (Deleted)
    SUBJECTIVE:   CHIEF COMPLAINT / HPI:   Neck and shoulder pain: hx of scapular bdy fracture, R. 2018.    PERTINENT  PMH / PSH: ***  OBJECTIVE:   There were no vitals taken for this visit.  ***  ASSESSMENT/PLAN:   No problem-specific Assessment & Plan notes found for this encounter.     Sandre Kitty, MD [facility]   {    This will disappear when note is signed, click to select method of visit    :1}

## 2021-01-03 DIAGNOSIS — Z419 Encounter for procedure for purposes other than remedying health state, unspecified: Secondary | ICD-10-CM | POA: Diagnosis not present

## 2021-01-09 ENCOUNTER — Ambulatory Visit: Payer: Medicaid Other

## 2021-01-09 NOTE — Progress Notes (Deleted)
     SUBJECTIVE:   CHIEF COMPLAINT / HPI:   [name] is a 30 y.o. female presents for STD testing  STD testing Patient reports that vaginal discharge started ***.  She notes that discharge appears ***.  She *** vaginal odors.  She denies vaginal pruritis, abnormal vaginal bleeding, dysuria, hematuria, frequency, abdominal pain pelvic pain, nausea, vomiting, fevers or new rash.   She has used *** with *** relief.   *** history of STIs.  She *** sexually active and *** use condoms.  Contraception: ***.  LMP ***.  She does not douche.   ***  Flowsheet Row Office Visit from 03/24/2020 in [facility]  PHQ-9 Total Score 2       Health Maintenance Due  Topic  . TETANUS/TDAP       PERTINENT  PMH / PSH:   OBJECTIVE:   There were no vitals taken for this visit.   General: Alert, no acute distress Cardio: Normal S1 and S2, RRR, no r/m/g Pulm: CTAB, normal work of breathing Abdomen: Bowel sounds normal. Abdomen soft and non-tender.  Extremities: No peripheral edema.  Neuro: Cranial nerves grossly intact   ASSESSMENT/PLAN:   No problem-specific Assessment & Plan notes found for this encounter.     Towanda Octave, MD PGY-2 [facility]

## 2021-01-10 ENCOUNTER — Ambulatory Visit: Payer: Medicaid Other | Admitting: Family Medicine

## 2021-01-10 NOTE — Progress Notes (Deleted)
    SUBJECTIVE:   CHIEF COMPLAINT / HPI:   Vaginal Discharge: Patient is a 30 y.o. female presenting with vaginal discharge for *** days.  She states the discharge is of *** consistency.  She endorses *** vaginal odor.  She is interested in screening for sexually transmitted infections today.  PERTINENT  PMH / PSH: ***None relevant  OBJECTIVE:   There were no vitals taken for this visit.   General: NAD, pleasant, able to participate in exam Respiratory: Normal effort, no obvious respiratory distress Pelvic: VULVA: normal appearing vulva with no masses, tenderness or lesions, VAGINA: Normal appearing vagina with normal color, no lesions, with {GYN VAGINAL DISCHARGE:21986} discharge present, ***CERVIX: No lesions, {GYN VAGINAL DISCHARGE:21986} discharge present,  Chaperone *** present for pelvic exam  ASSESSMENT/PLAN:   No problem-specific Assessment & Plan notes found for this encounter.    Assessment:  30 y.o. female with vaginal discharge for***days, as well as***.  Physical exam significant for*** discharge.  Wet prep performed today shows *** consistent with ***.  Patient is interested in STI screening.   Plan: -Wet prep as above.  Will treat with***. -GC/chlamydia pending -Will check HIV and RPR  Sabino Dick, DO [facility]

## 2021-01-16 ENCOUNTER — Ambulatory Visit (INDEPENDENT_AMBULATORY_CARE_PROVIDER_SITE_OTHER): Payer: Medicaid Other | Admitting: Student in an Organized Health Care Education/Training Program

## 2021-01-16 ENCOUNTER — Other Ambulatory Visit: Payer: Self-pay

## 2021-01-16 ENCOUNTER — Other Ambulatory Visit (HOSPITAL_COMMUNITY)
Admission: RE | Admit: 2021-01-16 | Discharge: 2021-01-16 | Disposition: A | Discharge status: Home / Self Care | Payer: Medicaid Other | Source: Ambulatory Visit | Attending: Family Medicine | Admitting: Family Medicine

## 2021-01-16 ENCOUNTER — Encounter: Payer: Self-pay | Admitting: Student in an Organized Health Care Education/Training Program

## 2021-01-16 VITALS — BP 110/76 | HR 114 | Ht 62.0 in | Wt 137.6 lb

## 2021-01-16 DIAGNOSIS — Z113 Encounter for screening for infections with a predominantly sexual mode of transmission: Secondary | ICD-10-CM | POA: Insufficient documentation

## 2021-01-16 DIAGNOSIS — R82998 Other abnormal findings in urine: Secondary | ICD-10-CM | POA: Diagnosis not present

## 2021-01-16 DIAGNOSIS — R4589 Other symptoms and signs involving emotional state: Secondary | ICD-10-CM

## 2021-01-16 DIAGNOSIS — R399 Unspecified symptoms and signs involving the genitourinary system: Secondary | ICD-10-CM | POA: Diagnosis not present

## 2021-01-16 LAB — POCT WET PREP (WET MOUNT)
Clue Cells Wet Prep Whiff POC: POSITIVE
Trichomonas Wet Prep HPF POC: ABSENT

## 2021-01-16 LAB — GLUCOSE, POCT (MANUAL RESULT ENTRY): POC Glucose: 91 mg/dl (ref 70–99)

## 2021-01-16 NOTE — Patient Instructions (Signed)
It was a pleasure to see you today!  To summarize our discussion for this visit: Thanks for coming in. We are testing for STIs by vaginal swab and blood work. I will message you on myschart if any treatments are needed.  Some additional health maintenance measures we should update are: Health Maintenance Due  Topic Date Due   TETANUS/TDAP  Never done     Call the clinic at [phone] if your symptoms worsen or you have any concerns.   Thank you for allowing me to take part in your care,  Dr. Jamelle Rushing

## 2021-01-16 NOTE — Progress Notes (Signed)
    SUBJECTIVE:   CHIEF COMPLAINT / HPI: STI screening  STI- 3 unprotected partners in the past few months. No known Sti contacts.  Gonorrhea and chlamydia in remote past.  Tubes tied. No concern for pregnancy. No symptoms currently.  No urinary symptoms. H/o sweet smelling urine and feels like she urinates a lot chronically.   Depression- poorly controlled. Would like to re-establish care with counseling at our clinic.  OBJECTIVE:   BP 110/76   Pulse (!) 114   Ht 5\' 2"  (1.575 m)   Wt 137 lb 9.6 oz (62.4 kg)   SpO2 99%   BMI 25.17 kg/m   Physical Exam Vitals and nursing note reviewed. Exam conducted with a chaperone present.  Constitutional:      General: She is not in acute distress.    Appearance: Normal appearance. She is normal weight. She is not ill-appearing or toxic-appearing.  Cardiovascular:     Rate and Rhythm: Normal rate.  Pulmonary:     Effort: Pulmonary effort is normal.     Breath sounds: Normal breath sounds.  Genitourinary:    General: Normal vulva.     Exam position: Lithotomy position.     Labia:        Right: No rash or lesion.        Left: No rash or lesion.      Vagina: Vaginal discharge (white) present.     Cervix: Normal.  Skin:    General: Skin is warm and dry.  Neurological:     General: No focal deficit present.     Mental Status: She is oriented to person, place, and time.  Psychiatric:        Mood and Affect: Mood normal.        Behavior: Behavior normal.   ASSESSMENT/PLAN:   Routine screening for STI (sexually transmitted infection) STI screen including wet prep positive for BV but patient is asymptomatic so no treatment sent at this time.   Depressed mood History of depression and has been getting counseling but unable to continue with current counselor. She would like to reestablish at our clinic.  Contacted Dr. who spoke with her today to set up close follow up.  Denies SI  Sweet urine odor Describes a sweet smell to  her urine and would like to be screened for diabetes.  Non-fasting CBG 91 today     Yvette Knapp, DO [facility]

## 2021-01-17 LAB — CERVICOVAGINAL ANCILLARY ONLY
Chlamydia: NEGATIVE
Comment: NEGATIVE
Comment: NORMAL
Neisseria Gonorrhea: NEGATIVE

## 2021-01-17 LAB — RPR: RPR Ser Ql: NONREACTIVE

## 2021-01-17 LAB — HIV ANTIBODY (ROUTINE TESTING W REFLEX): HIV Screen 4th Generation wRfx: NONREACTIVE

## 2021-01-18 DIAGNOSIS — R82998 Other abnormal findings in urine: Secondary | ICD-10-CM | POA: Insufficient documentation

## 2021-01-18 NOTE — Assessment & Plan Note (Signed)
Describes a sweet smell to her urine and would like to be screened for diabetes.  Non-fasting CBG 91 today

## 2021-01-18 NOTE — Assessment & Plan Note (Signed)
History of depression and has been getting counseling but unable to continue with current counselor. She would like to reestablish at our clinic.  Contacted Dr. [name] who spoke with her today to set up close follow up.  Denies SI

## 2021-01-18 NOTE — Assessment & Plan Note (Signed)
STI screen including wet prep positive for BV but patient is asymptomatic so no treatment sent at this time.

## 2021-01-25 ENCOUNTER — Ambulatory Visit: Payer: Medicaid Other | Admitting: Psychology

## 2021-01-26 ENCOUNTER — Ambulatory Visit: Payer: Medicaid Other | Admitting: Psychology

## 2021-01-31 ENCOUNTER — Ambulatory Visit: Payer: Medicaid Other | Admitting: Psychology

## 2021-02-02 DIAGNOSIS — Z419 Encounter for procedure for purposes other than remedying health state, unspecified: Secondary | ICD-10-CM | POA: Diagnosis not present

## 2021-02-19 ENCOUNTER — Encounter: Payer: Medicaid Other | Admitting: Psychology

## 2021-02-23 ENCOUNTER — Ambulatory Visit (INDEPENDENT_AMBULATORY_CARE_PROVIDER_SITE_OTHER): Payer: Medicaid Other | Admitting: Psychology

## 2021-02-23 ENCOUNTER — Other Ambulatory Visit: Payer: Self-pay

## 2021-02-23 DIAGNOSIS — R4589 Other symptoms and signs involving emotional state: Secondary | ICD-10-CM | POA: Diagnosis not present
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
## 2021-03-05 ENCOUNTER — Ambulatory Visit (INDEPENDENT_AMBULATORY_CARE_PROVIDER_SITE_OTHER): Payer: Medicaid Other | Admitting: Psychology

## 2021-03-05 ENCOUNTER — Other Ambulatory Visit: Payer: Self-pay

## 2021-03-05 DIAGNOSIS — F419 Anxiety disorder, unspecified: Secondary | ICD-10-CM | POA: Diagnosis not present

## 2021-03-05 DIAGNOSIS — Z419 Encounter for procedure for purposes other than remedying health state, unspecified: Secondary | ICD-10-CM | POA: Diagnosis not present
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
## 2021-03-19 ENCOUNTER — Ambulatory Visit (INDEPENDENT_AMBULATORY_CARE_PROVIDER_SITE_OTHER): Payer: Medicaid Other | Admitting: Psychology

## 2021-03-19 ENCOUNTER — Other Ambulatory Visit: Payer: Self-pay

## 2021-03-19 DIAGNOSIS — F419 Anxiety disorder, unspecified: Secondary | ICD-10-CM | POA: Diagnosis not present
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
## 2021-04-02 ENCOUNTER — Ambulatory Visit: Payer: Medicaid Other | Admitting: Psychology

## 2021-04-05 DIAGNOSIS — Z419 Encounter for procedure for purposes other than remedying health state, unspecified: Secondary | ICD-10-CM | POA: Diagnosis not present

## 2021-04-16 ENCOUNTER — Ambulatory Visit (INDEPENDENT_AMBULATORY_CARE_PROVIDER_SITE_OTHER): Payer: Medicaid Other | Admitting: Psychology

## 2021-04-16 ENCOUNTER — Other Ambulatory Visit: Payer: Self-pay

## 2021-04-16 DIAGNOSIS — F419 Anxiety disorder, unspecified: Secondary | ICD-10-CM | POA: Diagnosis not present

## 2021-04-16 NOTE — BH Specialist Note (Signed)
Integrated Behavioral Health Follow Up In-Person Visit  MRN: [id number] Name: [name]  Number of Integrated Behavioral Health Clinician visits:  9 Session Start time: 9  Session End time: 945 Total time: 45  minutes  Types of Service: Individual psychotherapy   Subjective: [name] is a 30 y.o. female  Patient was referred by Dr. Obie Dredge for anxiety. Patient reports the following symptoms/concerns: Pt presented anxious during today's appt. Pt shared she has been sober from alcohol for the past 2 weeks. Pt shared she also stopped vaping. Pt shared she has been having lots of anxious thoughts and has been attempting to cognitively challenge them.  Engaged in discussion of how anxious thoughts impact mood.  She shared about her past relationships and impact on current.   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   Goals Addressed: Patient will:  Reduce symptoms of: anxiety : panic attacks, feeling overwhelmed; racing thoughts  Increase knowledge and/or ability of: coping skills : alcohol use; thinking of decreasing usage; sitting in thoughts and challenging them with reality thoughts    Progress towards Goals: Ongoing   Interventions: Interventions utilized:  Motivational Interviewing, CBT Cognitive Behavioral Therapy, Supportive Counseling, and Supportive Reflection Standardized Assessments completed: Not Needed   Patient and/or Family Response: Pt engaged in treatment planning   Patient Centered  Plan: Patient is on the following Treatment Plan(s): Anxiety and alcohol reduction treatment plan  Assessment: Patient currently experiencing anxiety and struggles with use of alcohol.    Patient may benefit from processing trauma and alcohol decrease usage.   Plan: Follow up with behavioral health clinician on : 2 weeks Behavioral recommendations: challenging anxious thoughts; consider impact of medication Referral(s): [facility] (In Clinic)    Royetta Asal, PhD., LMFT

## 2021-05-04 ENCOUNTER — Ambulatory Visit: Payer: Medicaid Other | Admitting: Family Medicine

## 2021-05-05 DIAGNOSIS — Z419 Encounter for procedure for purposes other than remedying health state, unspecified: Secondary | ICD-10-CM | POA: Diagnosis not present

## 2021-06-04 ENCOUNTER — Other Ambulatory Visit: Payer: Self-pay

## 2021-06-04 ENCOUNTER — Ambulatory Visit (INDEPENDENT_AMBULATORY_CARE_PROVIDER_SITE_OTHER): Payer: Medicaid Other | Admitting: Psychology

## 2021-06-04 DIAGNOSIS — F419 Anxiety disorder, unspecified: Secondary | ICD-10-CM

## 2021-06-04 NOTE — BH Specialist Note (Signed)
Integrated Behavioral Health Follow Up In-Person Visit  MRN: [id number] Name: [name]  Number of Integrated Behavioral Health Clinician visits:  10 Session Start time: 140  Session End time: 210 Total time: 30 minutes  Types of Service: Individual psychotherapy  Subjective: [name] is a 30 y.o. female  Patient was referred by Dr. Obie Dredge for anxiety . Patient reports the following symptoms/concerns: Pt reported she started her new job and feels isolated at work because of her position. Pt reported feeling isolated in other parts of her life due to her husband's job and not being able to see children as often.  Pt shared she still has not been drinking. She reports anxious thoughts and feelings and has difficulty figuring out why.   Discussed recognizing anxiety and labeling feelings around it.    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   Goals Addressed: Patient will:  Reduce symptoms of: anxiety : panic attacks, feeling overwhelmed; racing thoughts  Increase knowledge and/or ability of: coping skills : alcohol use stopped sitting in thoughts and challenging them with reality thoughts    Progress towards Goals: Ongoing   Interventions: Interventions utilized:  Motivational Interviewing, CBT Cognitive Behavioral Therapy, Supportive Counseling, and Supportive Reflection Standardized Assessments completed: Not Needed   Patient and/or Family Response: Pt engaged in treatment planning   Patient Centered Plan: Patient is on the following Treatment Plan(s): Anxiety and alcohol reduction treatment plan  Assessment: Patient  currently experiencing anxiety and struggles with use of alcohol.    Patient may benefit from processing trauma and alcohol decrease usage.   Plan: Follow up with behavioral health clinician on : 2 weeks Behavioral recommendations: challenging/ recognizing anxious thoughts Referral(s): [facility] (In Clinic)     Royetta Asal, PhD., LMFT

## 2021-06-05 DIAGNOSIS — Z419 Encounter for procedure for purposes other than remedying health state, unspecified: Secondary | ICD-10-CM | POA: Diagnosis not present

## 2021-06-18 ENCOUNTER — Ambulatory Visit (INDEPENDENT_AMBULATORY_CARE_PROVIDER_SITE_OTHER): Payer: Medicaid Other | Admitting: Psychology

## 2021-06-18 ENCOUNTER — Other Ambulatory Visit: Payer: Self-pay

## 2021-06-18 DIAGNOSIS — F419 Anxiety disorder, unspecified: Secondary | ICD-10-CM | POA: Diagnosis not present

## 2021-06-18 NOTE — BH Specialist Note (Signed)
Integrated Behavioral Health Follow Up In-Person Visit  MRN: [id number] Name: [name]  Number of Integrated Behavioral Health Clinician visits:  11 Session Start time: 130  Session End time: 215 Total time: 45  minutes  Types of Service: Individual psychotherapy    Subjective: [name] is a 30 y.o. female  Patient was referred by Dr. Obie Dredge for anxiety. Patient reports the following symptoms/concerns: Pt shared about her childhood.  Processed emotions around parent relationship impacting her emotional regulation.   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   Goals Addressed: Patient will:  Reduce symptoms of: anxiety : panic attacks, feeling overwhelmed; racing thoughts  Increase knowledge and/or ability of: coping skills : alcohol use stopped sitting in thoughts and challenging them with reality thoughts; processing emotions around childhood   Progress towards Goals: Ongoing   Interventions: Interventions utilized:  Motivational Interviewing, CBT Cognitive Behavioral Therapy, Supportive Counseling, and Supportive Reflection Standardized Assessments completed: Not Needed   Patient and/or Family Response: Pt engaged in treatment planning   Patient Centered Plan: Patient is on the following Treatment Plan(s): Anxiety and alcohol reduction treatment plan  Assessment: Patient currently experiencing anxiety and struggles with use of alcohol.    Patient may benefit from processing trauma and alcohol decrease usage.   Plan: Follow up with behavioral health clinician on : 2 weeks Behavioral recommendations: challenging/  recognizing anxious thoughts Referral(s): [facility] (In Clinic)     Royetta Asal, PhD., LMFT

## 2021-07-02 ENCOUNTER — Ambulatory Visit: Payer: Medicaid Other | Admitting: Psychology

## 2021-07-05 DIAGNOSIS — Z419 Encounter for procedure for purposes other than remedying health state, unspecified: Secondary | ICD-10-CM | POA: Diagnosis not present

## 2021-07-24 ENCOUNTER — Encounter: Payer: Self-pay | Admitting: Psychology

## 2021-07-24 ENCOUNTER — Telehealth: Payer: Self-pay | Admitting: Psychology

## 2021-07-24 DIAGNOSIS — T485X2A Poisoning by other anti-common-cold drugs, intentional self-harm, initial encounter: Secondary | ICD-10-CM | POA: Diagnosis not present

## 2021-07-24 DIAGNOSIS — F430 Acute stress reaction: Secondary | ICD-10-CM | POA: Diagnosis not present

## 2021-07-24 DIAGNOSIS — F1090 Alcohol use, unspecified, uncomplicated: Secondary | ICD-10-CM | POA: Diagnosis not present

## 2021-07-24 DIAGNOSIS — T50902A Poisoning by unspecified drugs, medicaments and biological substances, intentional self-harm, initial encounter: Secondary | ICD-10-CM | POA: Diagnosis not present

## 2021-07-24 DIAGNOSIS — R45851 Suicidal ideations: Secondary | ICD-10-CM | POA: Diagnosis not present

## 2021-07-24 DIAGNOSIS — R1110 Vomiting, unspecified: Secondary | ICD-10-CM | POA: Diagnosis not present

## 2021-07-24 DIAGNOSIS — F419 Anxiety disorder, unspecified: Secondary | ICD-10-CM | POA: Diagnosis not present

## 2021-07-24 DIAGNOSIS — Z20822 Contact with and (suspected) exposure to covid-19: Secondary | ICD-10-CM | POA: Diagnosis not present

## 2021-07-24 DIAGNOSIS — T391X2A Poisoning by 4-Aminophenol derivatives, intentional self-harm, initial encounter: Secondary | ICD-10-CM | POA: Diagnosis not present

## 2021-07-24 DIAGNOSIS — F32A Depression, unspecified: Secondary | ICD-10-CM | POA: Diagnosis not present

## 2021-07-24 NOTE — Telephone Encounter (Signed)
Pt called clinic to speak with me. Returned pt call. Pt reported she attempted to overdose yesterday with tylenol and other medications and has been vomiting all morning. Pt reported she reached out to hotline and has shared this with her husband. PT reported she feels she can fight thoughts if they happen again.  Requested pt go to [facility] to be evaluated for medical concerns as well as giving more resources for safety. Pt agreed. Pt and I will do a safety check in call tomorrow at 930AM.    Pt shared she has also lost her medicaid.  Will address with pt when not in crisis.  Royetta Asal, PhD., LMFT

## 2021-07-24 NOTE — Telephone Encounter (Signed)
Called and left VM about pt needing to to go ED instead of [facility] due to medical concerns of overdosing.  Royetta Asal, PhD., LMFT

## 2021-07-25 ENCOUNTER — Telehealth: Payer: Self-pay | Admitting: Psychology

## 2021-07-25 NOTE — Telephone Encounter (Signed)
Called ED and spoke with nurse. Pt is currently under IVC and husband is with her. Will f/u when discharged.

## 2021-07-25 NOTE — Telephone Encounter (Signed)
Attempted to call patient twice for scheduled check-in. Left VM  Yvette Asal, PhD., LMFT

## 2021-07-26 DIAGNOSIS — Z62811 Personal history of psychological abuse in childhood: Secondary | ICD-10-CM | POA: Diagnosis not present

## 2021-07-26 DIAGNOSIS — F17290 Nicotine dependence, other tobacco product, uncomplicated: Secondary | ICD-10-CM | POA: Diagnosis not present

## 2021-07-26 DIAGNOSIS — F411 Generalized anxiety disorder: Secondary | ICD-10-CM | POA: Diagnosis not present

## 2021-07-26 DIAGNOSIS — R45851 Suicidal ideations: Secondary | ICD-10-CM | POA: Diagnosis not present

## 2021-07-26 DIAGNOSIS — Z62810 Personal history of physical and sexual abuse in childhood: Secondary | ICD-10-CM | POA: Diagnosis not present

## 2021-07-26 DIAGNOSIS — Z9151 Personal history of suicidal behavior: Secondary | ICD-10-CM | POA: Diagnosis not present

## 2021-07-27 ENCOUNTER — Telehealth: Payer: Self-pay

## 2021-07-27 DIAGNOSIS — F411 Generalized anxiety disorder: Secondary | ICD-10-CM | POA: Diagnosis not present

## 2021-07-27 NOTE — Telephone Encounter (Signed)
Transition Care Management Unsuccessful Follow-up Telephone Call  Date of discharge and from where:  07/26/2021-[facility]   Attempts:  1st Attempt  Reason for unsuccessful TCM follow-up call:  Left voice message

## 2021-07-28 NOTE — Telephone Encounter (Signed)
Transition Care Management Unsuccessful Follow-up Telephone Call  Date of discharge and from where:  07/26/2021-[facility]  Attempts:  2nd Attempt  Reason for unsuccessful TCM follow-up call:  Left voice message

## 2021-07-31 ENCOUNTER — Telehealth: Payer: Self-pay | Admitting: Psychology

## 2021-07-31 NOTE — Telephone Encounter (Signed)
Received a call from [facility] inpatient hospital to schedule f/u appt with pt for 12/28 at 11am.  Royetta Asal, PhD., LMFT

## 2021-07-31 NOTE — Telephone Encounter (Signed)
Transition Care Management Follow-up Telephone Call Date of discharge and from where: 07/25/2021 from [facility] How have you been since you were released from the hospital? Pt stated that she is feeling well and does not have any questions or concerns at this time.  Any questions or concerns? No  Items Reviewed: Did the pt receive and understand the discharge instructions provided? Yes  Medications obtained and verified? Yes  Other? No  Any new allergies since your discharge? No  Dietary orders reviewed? No Do you have support at home? Yes   Functional Questionnaire: (I = Independent and D = Dependent) ADLs: I  Bathing/Dressing- I  Meal Prep- I  Eating- I  Maintaining continence- I  Transferring/Ambulation- I  Managing Meds- I   Follow up appointments reviewed:  PCP Hospital f/u appt confirmed? Yes  Scheduled to see Royetta Asal on 08/01/2021 @ 11:00am. Specialist Hospital f/u appt confirmed? No   Are transportation arrangements needed? No  If their condition worsens, is the pt aware to call PCP or go to the Emergency Dept.? Yes Was the patient provided with contact information for the PCP's office or ED? Yes Was to pt encouraged to call back with questions or concerns? Yes

## 2021-08-01 ENCOUNTER — Ambulatory Visit (INDEPENDENT_AMBULATORY_CARE_PROVIDER_SITE_OTHER): Payer: Medicaid Other | Admitting: Psychology

## 2021-08-01 ENCOUNTER — Other Ambulatory Visit: Payer: Self-pay

## 2021-08-01 DIAGNOSIS — F419 Anxiety disorder, unspecified: Secondary | ICD-10-CM

## 2021-08-01 NOTE — BH Specialist Note (Signed)
Integrated Behavioral Health Follow Up In-Person Visit  MRN: [id number] Name: [name]  Number of Integrated Behavioral Health Clinician visits:  12 Session Start time: 11  Session End time: 1145 Total time: 45  minutes  Types of Service: Individual psychotherapy   Subjective: [name] is a 30 y.o. female  Patient was referred by Dr. Obie Dredge for anxiety. Patient reports the following symptoms/concerns: Pt was scheduled for f/u after hospitalization for suicide attempt.   Reflected on how anxiety impacted impulse of attempt. Reported attempt was made when she worried her husband had left.  Pt reports regrets making decision.  Pt reported she gained insight into how she cares for others to cope with anxiety. Discussed function of anxiety.   Pt reported she feels this hospitalization has taught her she does not want to be back in there and wants to move on.   Discussed things that help her feel less anxious and things she can control. Pt shared listening to music and motivational talks are helpful. Pt reported feeling her life has a lot of things that are anxiety provoking such as not seeing kids of christmas and husband job demotion.    Duration of problem: past year; Severity of problem: moderate  Objective: Mood: Anxious and Affect: Appropriate Risk of harm to self or others: suicide attempt last week and hospitalized ; denied thoughts plan and intent.    Life Context: Family and Social: hx of trauma in childhood School/Work: new job Life Changes: new marriage   Patient and/or Family's Strengths/Protective Factors: Social and Patent attorney   Goals Addressed: Patient will:  Reduce symptoms of: anxiety : panic attacks, feeling overwhelmed; racing thoughts  Increase knowledge and/or ability of: coping skills : alcohol use stopped sitting in thoughts and challenging them with reality thoughts; processing emotions around childhood   Progress towards  Goals: Ongoing   Interventions: Interventions utilized:  Motivational Interviewing, CBT Cognitive Behavioral Therapy, Supportive Counseling, and Supportive Reflection Standardized Assessments completed: Not Needed   Patient and/or Family Response: Pt engaged in treatment planning   Patient Centered Plan: Patient is on the following Treatment Plan(s): Anxiety and alcohol reduction treatment plan  Assessment: Patient currently experiencing anxiety and struggles with use of alcohol.    Patient may benefit from processing trauma and challenging anxiety.   Plan: Follow up with behavioral health clinician on : f/u to call 1 week to schedule Behavioral recommendations: challenging/ recognizing anxious thoughts; take prescribed medications from hospitalization Referral(s): [facility] (In Clinic)     Royetta Asal, PhD., LMFT

## 2021-08-05 DIAGNOSIS — Z419 Encounter for procedure for purposes other than remedying health state, unspecified: Secondary | ICD-10-CM | POA: Diagnosis not present

## 2021-08-07 ENCOUNTER — Telehealth: Payer: Self-pay | Admitting: Psychology

## 2021-08-07 NOTE — Telephone Encounter (Signed)
Scheduled with pt for 1/4 at 9am.  Pt denied SI.  Erlinda Hong, PhD., LMFT

## 2021-08-08 ENCOUNTER — Ambulatory Visit: Payer: Medicaid Other | Admitting: Psychology

## 2021-08-10 ENCOUNTER — Telehealth: Payer: Self-pay | Admitting: Psychology

## 2021-08-10 NOTE — Telephone Encounter (Signed)
Called patient. Patient is requesting refills on gabapentin and hydroxyzine that she was prescribed at [facility].   Advised patient that she would need appointment with PCP to refill these medications. Scheduled with PCP next Friday, 1/13.  Veronda Prude, RN

## 2021-08-10 NOTE — Telephone Encounter (Signed)
Error created in this phone note.  Contacted pt via [employer]

## 2021-08-15 ENCOUNTER — Other Ambulatory Visit: Payer: Self-pay

## 2021-08-15 ENCOUNTER — Ambulatory Visit (INDEPENDENT_AMBULATORY_CARE_PROVIDER_SITE_OTHER): Payer: Medicaid Other | Admitting: Psychology

## 2021-08-15 DIAGNOSIS — F419 Anxiety disorder, unspecified: Secondary | ICD-10-CM

## 2021-08-15 NOTE — BH Specialist Note (Signed)
Integrated Behavioral Health Follow Up In-Person Visit  MRN: [id number] Name: [name]  Number of [place] Clinician visits:  23 Session Start time: 9  Session End time: 930 Total time: 30 minutes  Types of Service: Individual psychotherapy   Subjective: [name] is a 31 y.o. female  Patient was referred by Dr. Sandi Carne for anxiety. Patient reports the following symptoms/concerns: Pt reported she was angry about the process of her hospitalization after her suicide attempt.  Pt reported she has been struggling with trust since the incident. Processed emotions around event and how it is related to previous traumas.  Discussed impact on therapeutic relationship.Left open if patient would like to continue therapy in future.   Pt to call if would like to schedule also offered option of referral to another therapist if she wanted.   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   Goals Addressed: Patient will:  Reduce symptoms of: anxiety : panic attacks, feeling overwhelmed; racing thoughts  Increase knowledge and/or ability of: coping skills : alcohol use stopped sitting in thoughts and challenging them with reality thoughts; processing emotions around childhood   Progress towards Goals: Ongoing   Interventions: Interventions utilized:  Motivational Interviewing, CBT Cognitive Behavioral Therapy, Supportive Counseling, and Supportive Reflection Standardized Assessments completed: Not Needed   Patient and/or Family Response: Pt  engaged in treatment planning   Patient Centered Plan: Patient is on the following Treatment Plan(s): Anxiety and alcohol reduction treatment plan  Assessment: Patient currently experiencing anxiety and struggles with use of alcohol.    Patient may benefit from processing trauma and challenging anxiety.   Plan: Follow up with behavioral health clinician on : pt to call if would like to schedule Behavioral recommendations: challenging/ recognizing anxious thoughts; take prescribed medications from hospitalization Referral(s): [facility] (In Clinic)     Erlinda Hong, PhD., LMFT

## 2021-08-17 ENCOUNTER — Ambulatory Visit: Payer: Medicaid Other | Admitting: Family Medicine

## 2021-08-29 ENCOUNTER — Telehealth: Payer: Self-pay

## 2021-08-29 NOTE — Telephone Encounter (Signed)
2nd attempt to reach patient.   No answer, no ability to LVM. Will attempt to reach patient again tomorrow.   Veronda Prude, RN

## 2021-08-29 NOTE — Telephone Encounter (Signed)
Patient LVM on nurse line requesting to speak with nurse regarding body aches since discharge from [facility]. Patient reports in voicemail that she has noticed pain in left chest,back and shoulder.   Attempted to call patient. No answer, no ability to LVM. Will attempt to reach patient again later this afternoon.   Talbot Grumbling, RN

## 2021-09-05 DIAGNOSIS — Z419 Encounter for procedure for purposes other than remedying health state, unspecified: Secondary | ICD-10-CM | POA: Diagnosis not present

## 2021-10-03 DIAGNOSIS — Z419 Encounter for procedure for purposes other than remedying health state, unspecified: Secondary | ICD-10-CM | POA: Diagnosis not present

## 2021-11-03 DIAGNOSIS — Z419 Encounter for procedure for purposes other than remedying health state, unspecified: Secondary | ICD-10-CM | POA: Diagnosis not present

## 2021-12-03 DIAGNOSIS — Z419 Encounter for procedure for purposes other than remedying health state, unspecified: Secondary | ICD-10-CM | POA: Diagnosis not present

## 2022-01-03 DIAGNOSIS — Z419 Encounter for procedure for purposes other than remedying health state, unspecified: Secondary | ICD-10-CM | POA: Diagnosis not present

## 2022-01-08 ENCOUNTER — Encounter: Payer: Self-pay | Admitting: *Deleted

## 2022-02-02 DIAGNOSIS — Z419 Encounter for procedure for purposes other than remedying health state, unspecified: Secondary | ICD-10-CM | POA: Diagnosis not present

## 2022-03-05 DIAGNOSIS — Z419 Encounter for procedure for purposes other than remedying health state, unspecified: Secondary | ICD-10-CM | POA: Diagnosis not present

## 2022-04-05 DIAGNOSIS — R55 Syncope and collapse: Secondary | ICD-10-CM | POA: Diagnosis not present

## 2022-04-05 DIAGNOSIS — R0902 Hypoxemia: Secondary | ICD-10-CM | POA: Diagnosis not present

## 2022-04-05 DIAGNOSIS — R112 Nausea with vomiting, unspecified: Secondary | ICD-10-CM | POA: Diagnosis not present

## 2022-04-05 DIAGNOSIS — D72829 Elevated white blood cell count, unspecified: Secondary | ICD-10-CM | POA: Diagnosis not present

## 2022-04-05 DIAGNOSIS — S50812A Abrasion of left forearm, initial encounter: Secondary | ICD-10-CM | POA: Diagnosis not present

## 2022-04-05 DIAGNOSIS — Z419 Encounter for procedure for purposes other than remedying health state, unspecified: Secondary | ICD-10-CM | POA: Diagnosis not present

## 2022-04-05 DIAGNOSIS — R109 Unspecified abdominal pain: Secondary | ICD-10-CM | POA: Diagnosis not present

## 2022-04-05 DIAGNOSIS — R4589 Other symptoms and signs involving emotional state: Secondary | ICD-10-CM | POA: Diagnosis not present

## 2022-04-05 DIAGNOSIS — Z743 Need for continuous supervision: Secondary | ICD-10-CM | POA: Diagnosis not present

## 2022-05-05 DIAGNOSIS — Z419 Encounter for procedure for purposes other than remedying health state, unspecified: Secondary | ICD-10-CM | POA: Diagnosis not present

## 2022-06-05 DIAGNOSIS — Z419 Encounter for procedure for purposes other than remedying health state, unspecified: Secondary | ICD-10-CM | POA: Diagnosis not present

## 2022-07-05 DIAGNOSIS — Z419 Encounter for procedure for purposes other than remedying health state, unspecified: Secondary | ICD-10-CM | POA: Diagnosis not present

## 2022-08-05 DIAGNOSIS — Z419 Encounter for procedure for purposes other than remedying health state, unspecified: Secondary | ICD-10-CM | POA: Diagnosis not present

## 2022-09-05 DIAGNOSIS — Z419 Encounter for procedure for purposes other than remedying health state, unspecified: Secondary | ICD-10-CM | POA: Diagnosis not present

## 2022-10-04 DIAGNOSIS — Z419 Encounter for procedure for purposes other than remedying health state, unspecified: Secondary | ICD-10-CM | POA: Diagnosis not present

## 2022-11-04 DIAGNOSIS — Z419 Encounter for procedure for purposes other than remedying health state, unspecified: Secondary | ICD-10-CM | POA: Diagnosis not present

## 2022-12-04 DIAGNOSIS — Z419 Encounter for procedure for purposes other than remedying health state, unspecified: Secondary | ICD-10-CM | POA: Diagnosis not present

## 2023-01-04 DIAGNOSIS — Z419 Encounter for procedure for purposes other than remedying health state, unspecified: Secondary | ICD-10-CM | POA: Diagnosis not present

## 2023-02-03 DIAGNOSIS — Z419 Encounter for procedure for purposes other than remedying health state, unspecified: Secondary | ICD-10-CM | POA: Diagnosis not present

## 2023-03-06 DIAGNOSIS — Z419 Encounter for procedure for purposes other than remedying health state, unspecified: Secondary | ICD-10-CM | POA: Diagnosis not present

## 2023-04-06 DIAGNOSIS — Z419 Encounter for procedure for purposes other than remedying health state, unspecified: Secondary | ICD-10-CM | POA: Diagnosis not present

## 2023-05-06 DIAGNOSIS — Z419 Encounter for procedure for purposes other than remedying health state, unspecified: Secondary | ICD-10-CM | POA: Diagnosis not present

## 2023-06-06 DIAGNOSIS — Z419 Encounter for procedure for purposes other than remedying health state, unspecified: Secondary | ICD-10-CM | POA: Diagnosis not present

## 2023-07-06 DIAGNOSIS — Z419 Encounter for procedure for purposes other than remedying health state, unspecified: Secondary | ICD-10-CM | POA: Diagnosis not present

## 2023-08-06 DIAGNOSIS — Z419 Encounter for procedure for purposes other than remedying health state, unspecified: Secondary | ICD-10-CM | POA: Diagnosis not present

## 2023-09-06 DIAGNOSIS — Z419 Encounter for procedure for purposes other than remedying health state, unspecified: Secondary | ICD-10-CM | POA: Diagnosis not present

## 2023-10-04 DIAGNOSIS — Z419 Encounter for procedure for purposes other than remedying health state, unspecified: Secondary | ICD-10-CM | POA: Diagnosis not present

## 2023-11-15 DIAGNOSIS — Z419 Encounter for procedure for purposes other than remedying health state, unspecified: Secondary | ICD-10-CM | POA: Diagnosis not present

## 2023-12-15 DIAGNOSIS — Z419 Encounter for procedure for purposes other than remedying health state, unspecified: Secondary | ICD-10-CM | POA: Diagnosis not present

## 2024-01-15 DIAGNOSIS — Z419 Encounter for procedure for purposes other than remedying health state, unspecified: Secondary | ICD-10-CM | POA: Diagnosis not present
